# Patient Record
Sex: Male | Born: 1945
Health system: Southern US, Community
[De-identification: ages and names within clinical notes are randomized; demographics above are authoritative.]

## PROBLEM LIST (undated history)

## (undated) DIAGNOSIS — D649 Anemia, unspecified: Secondary | ICD-10-CM

## (undated) DIAGNOSIS — B019 Varicella without complication: Secondary | ICD-10-CM

## (undated) DIAGNOSIS — M1990 Unspecified osteoarthritis, unspecified site: Secondary | ICD-10-CM

## (undated) DIAGNOSIS — G4730 Sleep apnea, unspecified: Secondary | ICD-10-CM

## (undated) DIAGNOSIS — E785 Hyperlipidemia, unspecified: Secondary | ICD-10-CM

## (undated) DIAGNOSIS — J439 Emphysema, unspecified: Secondary | ICD-10-CM

## (undated) HISTORY — DX: Unspecified osteoarthritis, unspecified site: M19.90

## (undated) HISTORY — PX: BASAL CELL CARCINOMA EXCISION: SHX1214

## (undated) HISTORY — DX: Hyperlipidemia, unspecified: E78.5

## (undated) HISTORY — DX: Emphysema, unspecified: J43.9

## (undated) HISTORY — DX: Varicella without complication: B01.9

---

## 2015-08-08 ENCOUNTER — Ambulatory Visit (INDEPENDENT_AMBULATORY_CARE_PROVIDER_SITE_OTHER): Payer: PRIVATE HEALTH INSURANCE | Admitting: Family Medicine

## 2015-08-08 ENCOUNTER — Ambulatory Visit (INDEPENDENT_AMBULATORY_CARE_PROVIDER_SITE_OTHER)
Admission: RE | Admit: 2015-08-08 | Discharge: 2015-08-08 | Disposition: A | Discharge status: Home / Self Care | Payer: PRIVATE HEALTH INSURANCE | Source: Ambulatory Visit | Attending: Family Medicine | Admitting: Family Medicine

## 2015-08-08 ENCOUNTER — Encounter: Payer: Self-pay | Admitting: Family Medicine

## 2015-08-08 VITALS — BP 140/84 | HR 76 | Wt 204.0 lb

## 2015-08-08 DIAGNOSIS — M545 Low back pain, unspecified: Secondary | ICD-10-CM | POA: Insufficient documentation

## 2015-08-08 MED ORDER — METHYLPREDNISOLONE ACETATE 80 MG/ML IJ SUSP
80.0000 mg | Freq: Once | INTRAMUSCULAR | Status: AC
  Administered 2015-08-08: 80 mg via INTRAMUSCULAR

## 2015-08-08 MED ORDER — MELOXICAM 15 MG PO TABS: 15.0000 mg | ORAL_TABLET | Freq: Every day | ORAL | Status: DC

## 2015-08-08 MED ORDER — KETOROLAC TROMETHAMINE 60 MG/2ML IM SOLN
60.0000 mg | Freq: Once | INTRAMUSCULAR | Status: AC
  Administered 2015-08-08: 60 mg via INTRAMUSCULAR

## 2015-08-08 MED ORDER — TIZANIDINE HCL 4 MG PO TABS: 4.0000 mg | ORAL_TABLET | Freq: Every evening | ORAL | Status: DC

## 2015-08-08 NOTE — Patient Instructions (Signed)
Great to see you  Ice 20 minutes 2 times daily. Usually after activity and before bed. Keep doing your stretches but only 4-5 times a week.  2 injections today to help Meolxicam daily for 10 days then as needed Zanaflex at night for next week then as needed.  Vitamin D 2000 IU daily  Xrays downstairs today  See me again in 2 weeks or so and then we will consider physical therapy or manipulation.

## 2015-08-08 NOTE — Progress Notes (Signed)
[facility] [address] [place], [place] [address] Phone: [phone] Subjective:    I CC: chronic back pain  [id number] Terry Cameron is a 70 y.o. male coming in with complaint of chronic back pain. Patient states that this is been going on for years. Patient denies any type of injury. Has seen many different providers including a chiropractor as well as a physical therapist. Patient states that he continues to have a dull, throbbing aching sensation that seems to be most of the time. Over the course last several weeks it seems to be worsening. States that he can even give him pain straightening up. Was able to walk for 2 days secondary to the pain. Patient denies any radiation down the legs or any numbness or tingling. Patient denies any fevers chills or any abnormal weight loss. Has never had any imaging done. Still been able to do daily activities up to the previous days mention. Rates the severity of pain at this time a 6 out of 10. Has responded to over-the-counter anti-inflammatories previously.     Past Medical History  Diagnosis Date  . Arthritis   . Chicken pox   . Emphysema of lung [facility])    Past Surgical History  Procedure Laterality Date  . Basal cell carcinoma excision     Social History   Social History  . Marital Status: Married    Spouse Name: N/A  . Number of Children: N/A  . Years of Education: N/A   Social History Main Topics  . Smoking status: Former Research scientist (life sciences)  . Smokeless tobacco: Never Used  . Alcohol Use: Yes  . Drug Use: No  . Sexual Activity: Not Asked   Other Topics Concern  . None   Social History Narrative  . None   Not on Fileno known drug allergies History reviewed. No pertinent family history.no history of rheumatological diseases  Past medical history, social, surgical and family history all reviewed in electronic medical record.  No pertanent information unless stated regarding to the chief complaint.    Review of Systems: No headache, visual changes, nausea, vomiting, diarrhea, constipation, dizziness, abdominal pain, skin rash, fevers, chills, night sweats, weight loss, swollen lymph nodes, body aches, joint swelling, muscle aches, chest pain, shortness of breath, mood changes.   Objective Blood pressure 140/84, pulse 76, weight 204 lb (92.534 kg).  General: No apparent distress alert and oriented x3 mood and affect normal, dressed appropriately.  HEENT: Pupils equal, extraocular movements intact  Respiratory: Patient's speak in full sentences and does not appear short of breath  Cardiovascular: No lower extremity edema, non tender, no erythema  Skin: Warm dry intact with no signs of infection or rash on extremities or on axial skeleton.  Abdomen: Soft nontender  Neuro: Cranial nerves II through XII are intact, neurovascularly intact in all extremities with 2+ DTRs and 2+ pulses.  Lymph: No lymphadenopathy of posterior or anterior cervical chain or axillae bilaterally.  Gait normal with good balance and coordination.  MSK:  Non tender with full range of motion and good stability and symmetric strength and tone of shoulders, elbows, wrist, hip, knee and ankles bilaterally.  Back Exam:  Inspection: Unremarkable  Motion: Flexion 45 deg, Extension 45 deg, Side Bending to 45 deg bilaterally,  Rotation to 45 deg bilaterally  SLR laying: Negative  XSLR laying: Negative  Palpable tenderness: tenderness over the L4-L5 area on the right side paraspinal musculature. No spinous process tenderness FABER: negative. Sensory change: Gross sensation  intact to all lumbar and sacral dermatomes.  Reflexes: 2+ at both patellar tendons, 2+ at achilles tendons, Babinski's downgoing.  Strength at foot  Plantar-flexion: 5/5 Dorsi-flexion: 5/5 Eversion: 5/5 Inversion: 5/5  Leg strength  Quad: 5/5 Hamstring: 5/5 Hip flexor: 5/5 Hip abductors: 4/5  Gait unremarkable.    Impression and Recommendations:      This case required medical decision making of moderate complexity.      Note: This dictation was prepared with Dragon dictation along with smaller phrase technology. Any transcriptional errors that result from this process are unintentional.

## 2015-08-08 NOTE — Assessment & Plan Note (Signed)
Patient does have low back pain that likely is muscular skeletal in nature. X-rays are pending see how much bony under modalities could be contribute in. Encourage patient to continue to do the stretching that he has learned for multiple years. We discussed core strengthening as well. Patient has work with a physical therapist previously. Patient given oral anti-inflammatories and a muscle relaxer at night. We discussed icing regimen. Patient come back in 2 weeks. At that time if doing better and x-rays remarkably normal we will consider osteopathic manipulation.

## 2015-08-29 ENCOUNTER — Encounter: Payer: Self-pay | Admitting: Family Medicine

## 2015-08-29 ENCOUNTER — Ambulatory Visit (INDEPENDENT_AMBULATORY_CARE_PROVIDER_SITE_OTHER): Payer: PRIVATE HEALTH INSURANCE | Admitting: Family Medicine

## 2015-08-29 VITALS — BP 132/72 | HR 85 | Wt 202.1 lb

## 2015-08-29 DIAGNOSIS — M545 Low back pain, unspecified: Secondary | ICD-10-CM

## 2015-08-29 DIAGNOSIS — M24542 Contracture, left hand: Secondary | ICD-10-CM | POA: Diagnosis not present

## 2015-08-29 NOTE — Patient Instructions (Signed)
Good to see you  Ice 20 minutes 2 times daily. Usually after activity and before bed. Use the meloxicam and the zanaflex for 3 days straight if any worsening symptoms Injected the contracture in the finger today .  Should get better in 2 weeks but if not then would need to see a Copy.  Concern with the loss of strength  See me when you need me.

## 2015-08-29 NOTE — Progress Notes (Signed)
[facility] [address] [place], [place] [address] Phone: [phone] Subjective:    CC: chronic back pain f/u  [id number] Terry Cameron is a 70 y.o. male coming in with complaint of chronic back pain. Patient was seen previously and had more of a muscular skeletal injury. Patient was given anti-inflammatories and a muscle relaxer as well as home exercises. States that within 48 hours was nearly pain-free. Mild tightness from time to time. Nothing that stopping him from activities. Overall feeling very well.  Patient was also complaining of a left small finger problem. Patient states that it seems to be getting tighter and tighter and harder to straighten completely. Patient states that he is noticing some mild weakness. Has been seen previously and stated that they were told that he had a fracture and would need surgery at some point.     Past Medical History  Diagnosis Date  . Arthritis   . Chicken pox   . Emphysema of lung [facility])    Past Surgical History  Procedure Laterality Date  . Basal cell carcinoma excision     Social History   Social History  . Marital Status: Married    Spouse Name: N/A  . Number of Children: N/A  . Years of Education: N/A   Social History Main Topics  . Smoking status: Former Research scientist (life sciences)  . Smokeless tobacco: Never Used  . Alcohol Use: Yes  . Drug Use: No  . Sexual Activity: Not on file   Other Topics Concern  . Not on file   Social History Narrative  . No narrative on file   Not on Fil[place] known drug allergies No family history on file.no history of rheumatological diseases  Past medical history, social, surgical and family history all reviewed in electronic medical record.  No pertanent information unless stated regarding to the chief complaint.   Review of Systems: No headache, visual changes, nausea, vomiting, diarrhea, constipation, dizziness, abdominal pain, skin rash, fevers, chills, night sweats,  weight loss, swollen lymph nodes, body aches, joint swelling, muscle aches, chest pain, shortness of breath, mood changes.   Objective Blood pressure 132/72, pulse 85, weight 202 lb 2 oz (91.683 kg), SpO2 95 %.  General: No apparent distress alert and oriented x3 mood and affect normal, dressed appropriately.  HEENT: Pupils equal, extraocular movements intact  Respiratory: Patient's speak in full sentences and does not appear short of breath  Cardiovascular: No lower extremity edema, non tender, no erythema  Skin: Warm dry intact with no signs of infection or rash on extremities or on axial skeleton.  Abdomen: Soft nontender  Neuro: Cranial nerves II through XII are intact, neurovascularly intact in all extremities with 2+ DTRs and 2+ pulses.  Lymph: No lymphadenopathy of posterior or anterior cervical chain or axillae bilaterally.  Gait normal with good balance and coordination.  MSK:  Non tender with full range of motion and good stability and symmetric strength and tone of shoulders, elbows, wrist, hip, knee and ankles bilaterally.   Left hand exam shows the patient does have a contracture deformity of the fifth finger. Unable to straighten it past 120. Patient does have a cystic formation within the tendon sheath proximal to the A2 pulley.  Back Exam:  Inspection: Unremarkable  Motion: Flexion 45 deg, Extension 45 deg, Side Bending to 45 deg bilaterally,  Rotation to 45 deg bilaterally  SLR laying: Negative  XSLR laying: Negative  Palpable tenderness:  Nontender today tenderness FABER: negative. Sensory change:  Gross sensation intact to all lumbar and sacral dermatomes.  Reflexes: 2+ at both patellar tendons, 2+ at achilles tendons, Babinski's downgoing.  Strength at foot  Plantar-flexion: 5/5 Dorsi-flexion: 5/5 Eversion: 5/5 Inversion: 5/5  Leg strength  Quad: 5/5 Hamstring: 5/5 Hip flexor: 5/5 Hip abductors: 4/5  Gait unremarkable.   After verbal consent patient was prepped  with alcohol swab and with a 25-gauge half-inch needle was injected with a total of 0.5 mL of 0.5% Marcaine and 0.5 mL of Kenalog 40 mg/dL in the fifth tendon sheath of the flexor tendon of the fifth finger on the left. No blood loss. Patient tolerated procedure well.  Impression and Recommendations:     This case required medical decision making of moderate complexity.      Note: This dictation was prepared with Dragon dictation along with smaller phrase technology. Any transcriptional errors that result from this process are unintentional.

## 2015-08-29 NOTE — Progress Notes (Signed)
Pre visit review using our clinic review tool, if applicable. No additional management support is needed unless otherwise documented below in the visit note. 

## 2015-08-29 NOTE — Assessment & Plan Note (Signed)
Patient given injection today. Hopefully this will decrease the size. Patient has anti-inflammatories for breathing breakthrough pain. Questionable ganglion cyst is in the area. We discussed the possibility of aspiration with a bigger needle patient will follow-up with a hand surgeon in 2 weeks it hasn't resolved.

## 2015-08-29 NOTE — Assessment & Plan Note (Signed)
Patient's immediately improved at this time. Seems to be muscular. Very mild arthritic changes noted on x-ray previously. Follow-up as needed.

## 2015-09-07 ENCOUNTER — Other Ambulatory Visit: Payer: Self-pay | Admitting: Family Medicine

## 2015-09-09 NOTE — Telephone Encounter (Signed)
Refill done.  

## 2017-01-01 ENCOUNTER — Ambulatory Visit (INDEPENDENT_AMBULATORY_CARE_PROVIDER_SITE_OTHER): Payer: Managed Care, Other (non HMO) | Admitting: Family Medicine

## 2017-01-01 ENCOUNTER — Encounter: Payer: Self-pay | Admitting: Family Medicine

## 2017-01-01 ENCOUNTER — Other Ambulatory Visit: Payer: Self-pay | Admitting: Family Medicine

## 2017-01-01 VITALS — BP 120/74 | HR 75 | Temp 98.4°F | Ht 70.0 in | Wt 201.8 lb

## 2017-01-01 DIAGNOSIS — Z23 Encounter for immunization: Secondary | ICD-10-CM | POA: Diagnosis not present

## 2017-01-01 DIAGNOSIS — E785 Hyperlipidemia, unspecified: Secondary | ICD-10-CM

## 2017-01-01 DIAGNOSIS — G4733 Obstructive sleep apnea (adult) (pediatric): Secondary | ICD-10-CM | POA: Diagnosis not present

## 2017-01-01 DIAGNOSIS — E663 Overweight: Secondary | ICD-10-CM

## 2017-01-01 DIAGNOSIS — Z1211 Encounter for screening for malignant neoplasm of colon: Secondary | ICD-10-CM | POA: Diagnosis not present

## 2017-01-01 DIAGNOSIS — Z1159 Encounter for screening for other viral diseases: Secondary | ICD-10-CM | POA: Insufficient documentation

## 2017-01-01 LAB — COMPREHENSIVE METABOLIC PANEL
ALBUMIN: 4 g/dL (ref 3.5–5.2)
ALK PHOS: 59 U/L (ref 39–117)
ALT: 18 U/L (ref 0–53)
AST: 18 U/L (ref 0–37)
BUN: 20 mg/dL (ref 6–23)
CO2: 28 mEq/L (ref 19–32)
Calcium: 9.6 mg/dL (ref 8.4–10.5)
Chloride: 108 mEq/L (ref 96–112)
Creatinine, Ser: 1.11 mg/dL (ref 0.40–1.50)
GFR: 69.69 mL/min (ref >60.00)
GLUCOSE: 104 mg/dL — AB (ref 70–99)
POTASSIUM: 4.2 meq/L (ref 3.5–5.1)
SODIUM: 141 meq/L (ref 135–145)
TOTAL PROTEIN: 6.8 g/dL (ref 6.0–8.3)
Total Bilirubin: 0.5 mg/dL (ref 0.2–1.2)

## 2017-01-01 LAB — HEMOGLOBIN A1C: Hgb A1c MFr Bld: 5.7 % (ref 4.6–6.5)

## 2017-01-01 LAB — LIPID PANEL
CHOLESTEROL: 237 mg/dL — AB (ref 0–200)
HDL: 56.4 mg/dL (ref >39.00)
LDL Cholesterol: 155 mg/dL — ABNORMAL HIGH (ref 0–99)
NONHDL: 181.06
Total CHOL/HDL Ratio: 4
Triglycerides: 129 mg/dL (ref 0.0–149.0)
VLDL: 25.8 mg/dL (ref 0.0–40.0)

## 2017-01-01 MED ORDER — ATORVASTATIN CALCIUM 10 MG PO TABS: 10.0000 mg | ORAL_TABLET | Freq: Every day | ORAL | 3 refills | Status: DC

## 2017-01-01 NOTE — Patient Instructions (Signed)
Nice to see you. We'll get you is set up with GI for colonoscopy. We'll get lab work today and contact you with results.

## 2017-01-01 NOTE — Assessment & Plan Note (Signed)
Patient falls into age range risk category. We will check hep C antibody today.

## 2017-01-01 NOTE — Assessment & Plan Note (Signed)
Has been out of medication for a number of months. We'll restart on this. We'll check lab work as outlined below.

## 2017-01-01 NOTE — Progress Notes (Signed)
[name], MD Phone: [phone]  Terry Cameron is a 71 y.o. male who presents today for new patient visit.  Hyperlipidemia: Taking Lipitor though has been out for several months. No abdominal pain or myalgias. No chest pain or shortness of breath.  OSA: Currently using CPAP nightly for 7-8 hours. He sleeps well with this. Wakes up well rested. No hypersomnia during the day.  Colon cancer screening: Patient reports having these done every 5 years. They've been clean. He is due for repeat per his report.  Active Ambulatory Problems    Diagnosis Date Noted  . Low back pain 08/08/2015  . 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 08/29/2015  . Hyperlipidemia 01/01/2017  . OSA (obstructive sleep apnea) 01/01/2017  . Colon cancer screening 01/01/2017  . Need for hepatitis C screening test 01/01/2017   Resolved Ambulatory Problems    Diagnosis Date Noted  . No Resolved Ambulatory Problems   Past Medical History:  Diagnosis Date  . Arthritis   . Chicken pox   . Emphysema of lung ([place])   . Hyperlipidemia     Family History  Problem Relation Age of Onset  . Hypertension Father   . Heart attack Father   . Diabetes Maternal Grandmother     Social History   Social History  . Marital status: Married    Spouse name: N/A  . Number of children: N/A  . Years of education: N/A   Occupational History  . Not on file.   Social History Main Topics  . Smoking status: Former Research scientist (life sciences)  . Smokeless tobacco: Never Used  . Alcohol use Yes  . Drug use: No  . Sexual activity: Not on file   Other Topics Concern  . Not on file   Social History Narrative  . No narrative on file    ROS  General:  Negative for nexplained weight loss, fever Skin: Negative for new or changing mole, sore that won't heal HEENT: Negative for trouble hearing, trouble seeing, ringing in ears, mouth sores, hoarseness, change in voice, dysphagia. CV:  Negative for chest pain, dyspnea, edema,  palpitations Resp: Negative for cough, dyspnea, hemoptysis GI: Negative for nausea, vomiting, diarrhea, constipation, abdominal pain, melena, hematochezia. GU: Negative for dysuria, incontinence, urinary hesitance, hematuria, vaginal or penile discharge, polyuria, sexual difficulty, lumps in testicle or breasts MSK: Negative for muscle cramps or aches, joint pain or swelling Neuro: Negative for headaches, weakness, numbness, dizziness, passing out/fainting Psych: Negative for depression, anxiety, memory problems  Objective  Physical Exam Vitals:   01/01/17 0909  BP: 120/74  Pulse: 75  Temp: 98.4 F (36.9 C)  SpO2: 98%    BP Readings from Last 3 Encounters:  01/01/17 120/74  08/29/15 132/72  08/08/15 140/84   Wt Readings from Last 3 Encounters:  01/01/17 201 lb 12.8 oz (91.5 kg)  08/29/15 202 lb 2 oz (91.7 kg)  08/08/15 204 lb (92.5 kg)    Physical Exam  Constitutional: No distress.  HENT:  Head: Normocephalic and atraumatic.  Mouth/Throat: Oropharynx is clear and moist.  Eyes: Pupils are equal, round, and reactive to light. Conjunctivae are normal.  Cardiovascular: Normal rate, regular rhythm and normal heart sounds.   Pulmonary/Chest: Effort normal and breath sounds normal.  Abdominal: Soft. Bowel sounds are normal. He exhibits no distension. There is no tenderness. There is no rebound and no guarding.  Musculoskeletal: He exhibits no edema.  Neurological: He is alert. Gait normal.  Skin: Skin is warm and dry. He is not  diaphoretic.  Psychiatric: Mood and affect normal.     Assessment/Plan:   Hyperlipidemia Has been out of medication for a number of months. We'll restart on this. We'll check lab work as outlined below.  OSA (obstructive sleep apnea) Using CPAP nightly. Seems to be well controlled. He'll continue CPAP.  Colon cancer screening Refer to GI for colonoscopy.  Need for hepatitis C screening test Patient falls into age range risk category. We will  check hep C antibody today.   Orders Placed This Encounter  Procedures  . Tdap vaccine greater than or equal to 7yo IM  . Pneumococcal conjugate vaccine 13-valent  . Comp Met (CMET)  . Lipid Profile  . HgB A1c  . Hepatitis C Antibody  . Ambulatory referral to Gastroenterology    Referral Priority:   Routine    Referral Type:   Consultation    Referral Reason:   Specialty Services Required    Number of Visits Requested:   1    Meds ordered this encounter  Medications  . DISCONTD: atorvastatin (LIPITOR) 10 MG tablet    Sig: Place 10 mg into the right eye daily.  Marland Kitchen atorvastatin (LIPITOR) 10 MG tablet    Sig: Take 1 tablet (10 mg total) by mouth daily.    Dispense:  90 tablet    Refill:  [place], MD [place]

## 2017-01-01 NOTE — Assessment & Plan Note (Signed)
Using CPAP nightly. Seems to be well controlled. He'll continue CPAP.

## 2017-01-01 NOTE — Assessment & Plan Note (Signed)
Refer to GI for colonoscopy.

## 2017-01-02 LAB — HEPATITIS C ANTIBODY: HCV Ab: NONREACTIVE

## 2017-01-04 ENCOUNTER — Telehealth: Payer: Self-pay | Admitting: Family Medicine

## 2017-01-04 NOTE — Telephone Encounter (Signed)
See result note.  

## 2017-01-04 NOTE — Telephone Encounter (Signed)
Pt called back returning your call. Thank you! °

## 2017-01-11 ENCOUNTER — Telehealth: Payer: Self-pay | Admitting: Gastroenterology

## 2017-01-11 NOTE — Telephone Encounter (Signed)
Patient returned a call to schedule a colonoscopy. Please call

## 2017-01-13 NOTE — Telephone Encounter (Signed)
LVM for patient callback in response to phone message.

## 2017-01-25 ENCOUNTER — Telehealth: Payer: Self-pay

## 2017-01-25 ENCOUNTER — Other Ambulatory Visit: Payer: Self-pay

## 2017-01-25 DIAGNOSIS — Z86010 Personal history of colonic polyps: Secondary | ICD-10-CM

## 2017-01-25 NOTE — Telephone Encounter (Signed)
Gastroenterology Pre-Procedure Review  Request Date: Monday, 10/8 Requesting Physician: Dr. Vicente Males  PATIENT REVIEW QUESTIONS: The patient responded to the following health history questions as indicated:    1. Are you having any GI issues? no 2. Do you have a personal history of Polyps? yes (benign) 3. Do you have a family history of Colon Cancer or Polyps? no 4. Diabetes Mellitus? no 5. Joint replacements in the past 12 months?no 6. Major health problems in the past 3 months?no 7. Any artificial heart valves, MVP, or defibrillator?no    MEDICATIONS & ALLERGIES:    Patient reports the following regarding taking any anticoagulation/antiplatelet therapy:   Plavix, Coumadin, Eliquis, Xarelto, Lovenox, Pradaxa, Brilinta, or Effient? no Aspirin? yes (81mg )  Patient confirms/reports the following medications:  Current Outpatient Prescriptions  Medication Sig Dispense Refill  . aspirin (ASPIR-81) 81 MG EC tablet Aspir-81  qd    . atorvastatin (LIPITOR) 10 MG tablet Take 1 tablet (10 mg total) by mouth daily. 90 tablet 3  . cyclobenzaprine (FLEXERIL) 10 MG tablet cyclobenzaprine 10 mg tablet  Take 1 tablet 3 times a day by oral route as needed for 10 days.     No current facility-administered medications for this visit.     Patient confirms/reports the following allergies:  No Known Allergies  No orders of the defined types were placed in this encounter.   AUTHORIZATION INFORMATION Primary Insurance: 1D#: Group #:  Secondary Insurance: 1D#: Group #:  SCHEDULE INFORMATION: Date: 10/8 Time: Location: [place]

## 2017-02-05 ENCOUNTER — Other Ambulatory Visit (INDEPENDENT_AMBULATORY_CARE_PROVIDER_SITE_OTHER): Payer: Managed Care, Other (non HMO)

## 2017-02-05 ENCOUNTER — Telehealth: Payer: Self-pay

## 2017-02-05 DIAGNOSIS — E785 Hyperlipidemia, unspecified: Secondary | ICD-10-CM

## 2017-02-05 LAB — HEPATIC FUNCTION PANEL
ALT: 21 U/L (ref 0–53)
AST: 20 U/L (ref 0–37)
Albumin: 4.1 g/dL (ref 3.5–5.2)
Alkaline Phosphatase: 55 U/L (ref 39–117)
BILIRUBIN DIRECT: 0.1 mg/dL (ref 0.0–0.3)
TOTAL PROTEIN: 6.7 g/dL (ref 6.0–8.3)
Total Bilirubin: 0.5 mg/dL (ref 0.2–1.2)

## 2017-02-05 LAB — LDL CHOLESTEROL, DIRECT: Direct LDL: 86 mg/dL

## 2017-02-05 NOTE — Telephone Encounter (Signed)
Patient came in today complaining of nose bleeds. Patient states that the bleeding is not excessive and can be stopped with tissues and pressure. Patient has not had any bleeding today. The last one was 2 days ago. PCP is aware and recommended Saline nasal spray. Advised patient to try the saline spray and if nose bleeds persist, he should call us and make an appointment with PCP. VS within normal limits.   BP 128/70 P 68 T 97.9 R 16 O2 97%

## 2017-02-05 NOTE — Telephone Encounter (Signed)
Noted and agree. 

## 2017-02-10 ENCOUNTER — Telehealth: Payer: Self-pay | Admitting: Family Medicine

## 2017-02-10 NOTE — Telephone Encounter (Signed)
Pt called and wanted to find out more about his last lab results. He also mentioned that he is continuing to have bloody nose.  Call pt @ [phone]

## 2017-02-10 NOTE — Telephone Encounter (Signed)
Please get patient set up for evaluation of his nosebleeds in the office. Thanks.

## 2017-02-10 NOTE — Telephone Encounter (Signed)
Patient notified of lab results, patient states he still has a nose bleed every morning

## 2017-02-10 NOTE — Telephone Encounter (Signed)
Patient is scheduled   

## 2017-02-12 ENCOUNTER — Encounter: Payer: Self-pay | Admitting: Family Medicine

## 2017-02-12 ENCOUNTER — Ambulatory Visit (INDEPENDENT_AMBULATORY_CARE_PROVIDER_SITE_OTHER): Payer: Managed Care, Other (non HMO) | Admitting: Family Medicine

## 2017-02-12 VITALS — BP 118/70 | HR 82 | Temp 97.7°F | Wt 201.2 lb

## 2017-02-12 DIAGNOSIS — R040 Epistaxis: Secondary | ICD-10-CM | POA: Diagnosis not present

## 2017-02-12 DIAGNOSIS — G4733 Obstructive sleep apnea (adult) (pediatric): Secondary | ICD-10-CM | POA: Diagnosis not present

## 2017-02-12 LAB — CBC
HCT: 41.8 % (ref 39.0–52.0)
HEMOGLOBIN: 13.8 g/dL (ref 13.0–17.0)
MCHC: 33.1 g/dL (ref 30.0–36.0)
MCV: 98 fl (ref 78.0–100.0)
PLATELETS: 210 10*3/uL (ref 150.0–400.0)
RBC: 4.27 Mil/uL (ref 4.22–5.81)
RDW: 13.5 % (ref 11.5–15.5)
WBC: 6.8 10*3/uL (ref 4.0–10.5)

## 2017-02-12 NOTE — Assessment & Plan Note (Signed)
No discretely identifiable source. Discussed nasal saline gel. Does sound as though he might have allergic rhinitis or slight viral illness. He can try adding Claritin or Flonase as well. Discussed methods for getting nosebleeds to stop. If it is persistent or excessive he'll be evaluated. We'll check a CBC.

## 2017-02-12 NOTE — Patient Instructions (Signed)
Nice to see you. We'll check some lab work today and contact you with the results. We will get you set up for a sleep study. If you have bleeding which you cannot get to stop within 15 minutes please be evaluated. You can try nasal saline gel or could also try adding Claritin or Flonase to help with your allergy symptoms.

## 2017-02-12 NOTE — Progress Notes (Signed)
  [name], MD Phone: [phone]  Terry Cameron is a 71 y.o. male who presents today for follow-up.  OSA: Notes it has been about 6 years since his last sleep study. He does note restless sleep. He can fall asleep during the day fairly easily. Somewhat well rested in the morning.  Patient has had intermittent nosebleeds over the last week or so. Notes that it will start dripping a little bit of blood out of his right nostril. He's had issues in the past that needed cauterization. He noted one time in the last week having a little bit of blood when brushing his teeth though otherwise no other bleeding. He's tried nasal saline which has been beneficial. No bleeding yesterday or today. No family history of bleeding. Patient does report some sneezing and rhinorrhea recently.   ROS see history of present illness  Objective  Physical Exam Vitals:   02/12/17 0828  BP: 118/70  Pulse: 82  Temp: 97.7 F (36.5 C)  SpO2: 98%    BP Readings from Last 3 Encounters:  02/12/17 118/70  01/01/17 120/74  08/29/15 132/72   Wt Readings from Last 3 Encounters:  02/12/17 201 lb 3.2 oz (91.3 kg)  01/01/17 201 lb 12.8 oz (91.5 kg)  08/29/15 202 lb 2 oz (91.7 kg)    Physical Exam  Constitutional: No distress.  HENT:  Nasal mucosa slightly edematous, no obvious areas of bleeding  Eyes: Pupils are equal, round, and reactive to light. Conjunctivae are normal.  Cardiovascular: Normal rate, regular rhythm and normal heart sounds.   Pulmonary/Chest: Effort normal and breath sounds normal.  Neurological: He is alert. Gait normal.  Skin: He is not diaphoretic.     Assessment/Plan: Please see individual problem list.  OSA (obstructive sleep apnea) We will get him set up for a sleep study given some hypersomnia.  Epistaxis No discretely identifiable source. Discussed nasal saline gel. Does sound as though he might have allergic rhinitis or slight viral illness. He can try adding Claritin  or Flonase as well. Discussed methods for getting nosebleeds to stop. If it is persistent or excessive he'll be evaluated. We'll check a CBC.   Orders Placed This Encounter  Procedures  . CBC  . Split night study    Standing Status:   Future    Standing Expiration Date:   02/12/2018    Order Specific Question:   Where should this test be performed:    Answer:   [place]    [name], MD [place]

## 2017-02-12 NOTE — Assessment & Plan Note (Signed)
We will get him set up for a sleep study given some hypersomnia.

## 2017-02-16 ENCOUNTER — Encounter: Payer: Self-pay | Admitting: Family Medicine

## 2017-02-22 ENCOUNTER — Ambulatory Visit: Payer: Managed Care, Other (non HMO) | Admitting: Certified Registered"

## 2017-02-22 ENCOUNTER — Encounter: Payer: Self-pay | Admitting: *Deleted

## 2017-02-22 ENCOUNTER — Encounter
Admission: RE | Disposition: A | Discharge status: Home / Self Care | Payer: Self-pay | Source: Ambulatory Visit | Attending: Gastroenterology

## 2017-02-22 ENCOUNTER — Ambulatory Visit
Admission: RE | Admit: 2017-02-22 | Discharge: 2017-02-22 | Disposition: A | Discharge status: Home / Self Care | Payer: Managed Care, Other (non HMO) | Source: Ambulatory Visit | Attending: Gastroenterology | Admitting: Gastroenterology

## 2017-02-22 DIAGNOSIS — Z1212 Encounter for screening for malignant neoplasm of rectum: Secondary | ICD-10-CM

## 2017-02-22 DIAGNOSIS — Z8249 Family history of ischemic heart disease and other diseases of the circulatory system: Secondary | ICD-10-CM | POA: Diagnosis not present

## 2017-02-22 DIAGNOSIS — Z87891 Personal history of nicotine dependence: Secondary | ICD-10-CM | POA: Diagnosis not present

## 2017-02-22 DIAGNOSIS — D123 Benign neoplasm of transverse colon: Secondary | ICD-10-CM | POA: Diagnosis not present

## 2017-02-22 DIAGNOSIS — K5730 Diverticulosis of large intestine without perforation or abscess without bleeding: Secondary | ICD-10-CM | POA: Diagnosis not present

## 2017-02-22 DIAGNOSIS — Z79899 Other long term (current) drug therapy: Secondary | ICD-10-CM | POA: Diagnosis not present

## 2017-02-22 DIAGNOSIS — D122 Benign neoplasm of ascending colon: Secondary | ICD-10-CM | POA: Insufficient documentation

## 2017-02-22 DIAGNOSIS — E785 Hyperlipidemia, unspecified: Secondary | ICD-10-CM | POA: Insufficient documentation

## 2017-02-22 DIAGNOSIS — Z1211 Encounter for screening for malignant neoplasm of colon: Secondary | ICD-10-CM | POA: Insufficient documentation

## 2017-02-22 DIAGNOSIS — Z7982 Long term (current) use of aspirin: Secondary | ICD-10-CM | POA: Diagnosis not present

## 2017-02-22 DIAGNOSIS — D120 Benign neoplasm of cecum: Secondary | ICD-10-CM | POA: Diagnosis not present

## 2017-02-22 DIAGNOSIS — M1990 Unspecified osteoarthritis, unspecified site: Secondary | ICD-10-CM | POA: Insufficient documentation

## 2017-02-22 DIAGNOSIS — Z85828 Personal history of other malignant neoplasm of skin: Secondary | ICD-10-CM | POA: Insufficient documentation

## 2017-02-22 DIAGNOSIS — J439 Emphysema, unspecified: Secondary | ICD-10-CM | POA: Diagnosis not present

## 2017-02-22 DIAGNOSIS — Z86010 Personal history of colonic polyps: Secondary | ICD-10-CM

## 2017-02-22 DIAGNOSIS — Z833 Family history of diabetes mellitus: Secondary | ICD-10-CM | POA: Diagnosis not present

## 2017-02-22 DIAGNOSIS — G4730 Sleep apnea, unspecified: Secondary | ICD-10-CM | POA: Diagnosis not present

## 2017-02-22 HISTORY — DX: Sleep apnea, unspecified: G47.30

## 2017-02-22 HISTORY — PX: COLONOSCOPY WITH PROPOFOL: SHX5780

## 2017-02-22 SURGERY — COLONOSCOPY WITH PROPOFOL
Anesthesia: General

## 2017-02-22 MED ORDER — PROPOFOL 500 MG/50ML IV EMUL
INTRAVENOUS | Status: DC | PRN
  Administered 2017-02-22: 150 ug/kg/min via INTRAVENOUS

## 2017-02-22 MED ORDER — PHENYLEPHRINE HCL 10 MG/ML IJ SOLN
INTRAMUSCULAR | Status: AC
  Filled 2017-02-22: qty 1

## 2017-02-22 MED ORDER — PHENYLEPHRINE HCL 10 MG/ML IJ SOLN
INTRAMUSCULAR | Status: DC | PRN
  Administered 2017-02-22: 100 ug via INTRAVENOUS

## 2017-02-22 MED ORDER — SODIUM CHLORIDE 0.9 % IV SOLN
INTRAVENOUS | Status: DC
  Administered 2017-02-22: 1000 mL via INTRAVENOUS
  Administered 2017-02-22: 08:00:00 via INTRAVENOUS

## 2017-02-22 MED ORDER — LIDOCAINE HCL (PF) 2 % IJ SOLN
INTRAMUSCULAR | Status: AC
  Filled 2017-02-22: qty 10

## 2017-02-22 MED ORDER — LIDOCAINE HCL (CARDIAC) 20 MG/ML IV SOLN
INTRAVENOUS | Status: DC | PRN
  Administered 2017-02-22: 40 mg via INTRAVENOUS

## 2017-02-22 MED ORDER — PROPOFOL 10 MG/ML IV BOLUS
INTRAVENOUS | Status: DC | PRN
  Administered 2017-02-22: 60 mg via INTRAVENOUS

## 2017-02-22 MED ORDER — PROPOFOL 500 MG/50ML IV EMUL
INTRAVENOUS | Status: AC
  Filled 2017-02-22: qty 50

## 2017-02-22 NOTE — Anesthesia Preprocedure Evaluation (Signed)
Anesthesia Evaluation  Patient identified by MRN, date of birth, ID band Patient awake    Reviewed: Allergy & Precautions, NPO status , Patient's Chart, lab work & pertinent test results, reviewed documented beta blocker date and time   Airway Mallampati: II  TM Distance: >3 FB     Dental  (+) Chipped   Pulmonary sleep apnea , COPD, former smoker,           Cardiovascular + Peripheral Vascular Disease       Neuro/Psych    GI/Hepatic   Endo/Other    Renal/GU      Musculoskeletal  (+) Arthritis ,   Abdominal   Peds  Hematology   Anesthesia Other Findings   Reproductive/Obstetrics                             Anesthesia Physical Anesthesia Plan  ASA: III  Anesthesia Plan: General   Post-op Pain Management:    Induction: Intravenous  PONV Risk Score and Plan:   Airway Management Planned:   Additional Equipment:   Intra-op Plan:   Post-operative Plan:   Informed Consent: I have reviewed the patients History and Physical, chart, labs and discussed the procedure including the risks, benefits and alternatives for the proposed anesthesia with the patient or authorized representative who has indicated his/her understanding and acceptance.     Plan Discussed with: CRNA  Anesthesia Plan Comments:         Anesthesia Quick Evaluation

## 2017-02-22 NOTE — H&P (Signed)
  [name] MD [address] [place], [place] [address] Phone: [phone] Fax : [phone]  Primary Care Physician:  Leone Haven, MD Primary Gastroenterologist:  Dr. [name]   Pre-Procedure History & Physical: HPI:  Terry Cameron is a 71 y.o. male is here for an colonoscopy.   Past Medical History:  Diagnosis Date  . Arthritis   . Chicken pox   . Emphysema of lung ([place])   . Hyperlipidemia   . Sleep apnea     Past Surgical History:  Procedure Laterality Date  . BASAL CELL CARCINOMA EXCISION      Prior to Admission medications   Medication Sig Start Date End Date Taking? Authorizing Provider  aspirin (ASPIR-81) 81 MG EC tablet Aspir-81  qd    [provider]  atorvastatin (LIPITOR) 10 MG tablet Take 1 tablet (10 mg total) by mouth daily. 01/01/17   Leone Haven, MD  cyclobenzaprine (FLEXERIL) 10 MG tablet cyclobenzaprine 10 mg tablet  Take 1 tablet 3 times a day by oral route as needed for 10 days.    [provider]    Allergies as of 01/25/2017  . (No Known Allergies)    Family History  Problem Relation Age of Onset  . Hypertension Father   . Heart attack Father   . Diabetes Maternal Grandmother     Social History   Social History  . Marital status: Married    Spouse name: N/A  . Number of children: N/A  . Years of education: N/A   Occupational History  . Not on file.   Social History Main Topics  . Smoking status: Former Smoker    Quit date: 1974  . Smokeless tobacco: Never Used  . Alcohol use Yes  . Drug use: No  . Sexual activity: Not on file   Other Topics Concern  . Not on file   Social History Narrative  . No narrative on file    Review of Systems: See HPI, otherwise negative ROS  Physical Exam: BP 134/69   Pulse 75   Temp (!) 97.3 F (36.3 C) (Tympanic)   Resp 16   Ht 5\' 10"  (1.778 m)   Wt 200 lb (90.7 kg)   SpO2 99%   BMI 28.70 kg/m  General:   Alert,  pleasant and cooperative  in NAD Head:  Normocephalic and atraumatic. Neck:  Supple; no masses or thyromegaly. Lungs:  Clear throughout to auscultation.    Heart:  Regular rate and rhythm. Abdomen:  Soft, nontender and nondistended. Normal bowel sounds, without guarding, and without rebound.   Neurologic:  Alert and  oriented x4;  grossly normal neurologically.  Impression/Plan: Terry Cameron is here for an colonoscopy to be performed for Screening colonoscopy average risk    Risks, benefits, limitations, and alternatives regarding  colonoscopy have been reviewed with the patient.  Questions have been answered.  All parties agreeable.   [name], MD  02/22/2017, 8:03 AM

## 2017-02-22 NOTE — Op Note (Signed)
[facility] Gastroenterology Patient Name: Terry Cameron Procedure Date: 02/22/2017 8:04 AM MRN: [id number] Account #: [id number] Date of Birth: [birth year] Admit Type: Outpatient Age: 71 Room: [facility] ENDO ROOM 4 Gender: Male Note Status: Finalized Procedure:            Colonoscopy Indications:          Screening for colorectal malignant neoplasm Providers:            [name] MD, MD Referring MD:         Angela Adam. Caryl Bis (Referring MD) Medicines:            Monitored Anesthesia Care Complications:        No immediate complications. Procedure:            Pre-Anesthesia Assessment:                       - Prior to the procedure, a History and Physical was                        performed, and patient medications, allergies and                        sensitivities were reviewed. The patient's tolerance of                        previous anesthesia was reviewed.                       - The risks and benefits of the procedure and the                        sedation options and risks were discussed with the                        patient. All questions were answered and informed                        consent was obtained.                       - ASA Grade Assessment: III - A patient with severe                        systemic disease.                       After obtaining informed consent, the colonoscope was                        passed under direct vision. Throughout the procedure,                        the patient's blood pressure, pulse, and oxygen                        saturations were monitored continuously. The                        Colonoscope was introduced through the anus and  advanced to the the cecum, identified by the                        appendiceal orifice, IC valve and transillumination.                        The colonoscopy was performed with ease. The patient                        tolerated the procedure well. The  quality of the bowel                        preparation was good. Findings:      The perianal and digital rectal examinations were normal.      Multiple small-mouthed diverticula were found in the entire colon.      Two sessile polyps were found in the transverse colon. The polyps were 3       to 5 mm in size. These polyps were removed with a cold snare. Resection       and retrieval were complete.      A 15 mm polyp was found in the transverse colon. The polyp was sessile.       Preparations were made for mucosal resection. Eleview was injected to       raise the lesion. Snare mucosal resection was performed. Resection and       retrieval were complete. To close a defect after mucosal resection, one       hemostatic clip was successfully placed. There was no bleeding during,       or at the end, of the procedure.      Three sessile polyps were found in the ascending colon. The polyps were       3 to 6 mm in size. These polyps were removed with a cold snare.       Resection and retrieval were complete.      A 3 mm polyp was found in the ascending colon. The polyp was sessile.       The polyp was removed with a cold biopsy forceps. Resection and       retrieval were complete.      Two sessile polyps were found in the ascending colon. The polyps were 3       to 4 mm in size. These polyps were removed with a cold biopsy forceps.       Resection and retrieval were complete.      Three sessile polyps were found in the cecum. The polyps were 3 to 4 mm       in size. These polyps were removed with a cold biopsy forceps. Resection       and retrieval were complete. Impression:           - Diverticulosis in the entire examined colon.                       - Two 3 to 5 mm polyps in the transverse colon, removed                        with a cold snare. Resected and retrieved.                       - One 15 mm polyp in  the transverse colon, removed with                        mucosal resection.  Resected and retrieved. Clip was                        placed.                       - Three 3 to 6 mm polyps in the ascending colon,                        removed with a cold snare. Resected and retrieved.                       - One 3 mm polyp in the ascending colon, removed with a                        cold biopsy forceps. Resected and retrieved.                       - Two 3 to 4 mm polyps in the ascending colon, removed                        with a cold biopsy forceps. Resected and retrieved.                       - Three 3 to 4 mm polyps in the cecum, removed with a                        cold biopsy forceps. Resected and retrieved.                       - Mucosal resection was performed. Resection and                        retrieval were complete. Recommendation:       - Discharge patient to home (with escort).                       - Resume previous diet.                       - Continue present medications.                       - No ibuprofen, naproxen, or other non-steroidal                        anti-inflammatory drugs for 6 weeks after polyp removal.                       - Await pathology results.                       - Repeat colonoscopy in 3 years for surveillance. Procedure Code(s):    --- Professional ---                       [id number], 1, Colonoscopy, flexible; with endoscopic  mucosal resection                       [id number], Colonoscopy, flexible; with removal of tumor(s),                        polyp(s), or other lesion(s) by snare technique                       45380, 20, Colonoscopy, flexible; with biopsy, single                        or multiple Diagnosis Code(s):    --- Professional ---                       Z12.11, Encounter for screening for malignant neoplasm                        of colon                       D12.3, Benign neoplasm of transverse colon (hepatic                        flexure or splenic flexure)                        D12.2, Benign neoplasm of ascending colon                       D12.0, Benign neoplasm of cecum                       K57.30, Diverticulosis of large intestine without                        perforation or abscess without bleeding CPT copyright 2016 American Medical Association. All rights reserved. The codes documented in this report are preliminary and upon coder review may  be revised to meet current compliance requirements. [name], MD [name] MD, MD 02/22/2017 8:46:20 AM This report has been signed electronically. Number of Addenda: 0 Note Initiated On: 02/22/2017 8:04 AM Scope Withdrawal Time: 0 hours 30 minutes 42 seconds  Total Procedure Duration: 0 hours 32 minutes 13 seconds       [facility]

## 2017-02-22 NOTE — Anesthesia Post-op Follow-up Note (Signed)
Anesthesia QCDR form completed.        

## 2017-02-22 NOTE — Transfer of Care (Signed)
Immediate Anesthesia Transfer of Care Note  Patient: Terry Cameron  Procedure(s) Performed: COLONOSCOPY WITH PROPOFOL (N/A )  Patient Location: Endoscopy Unit  Anesthesia Type:General  Level of Consciousness: drowsy and patient cooperative  Airway & Oxygen Therapy: Patient Spontanous Breathing and Patient connected to nasal cannula oxygen  Post-op Assessment: Report given to RN and Post -op Vital signs reviewed and stable  Post vital signs: Reviewed and stable  Last Vitals:  Vitals:   02/22/17 0746 02/22/17 0847  BP: 134/69 102/64  Pulse: 75 69  Resp: 16 14  Temp: (!) 36.3 C (!) 36.1 C  SpO2: 99% 100%    Last Pain:  Vitals:   02/22/17 0746  TempSrc: Tympanic         Complications: No apparent anesthesia complications

## 2017-02-22 NOTE — Anesthesia Postprocedure Evaluation (Signed)
Anesthesia Post Note  Patient: Courtney Fenlon  Procedure(s) Performed: COLONOSCOPY WITH PROPOFOL (N/A )  Patient location during evaluation: Endoscopy Anesthesia Type: General Level of consciousness: awake and alert Pain management: pain level controlled Vital Signs Assessment: post-procedure vital signs reviewed and stable Respiratory status: spontaneous breathing, nonlabored ventilation, respiratory function stable and patient connected to nasal cannula oxygen Cardiovascular status: blood pressure returned to baseline and stable Postop Assessment: no apparent nausea or vomiting Anesthetic complications: no     Last Vitals:  Vitals:   02/22/17 0907 02/22/17 0917  BP: 120/70 123/75  Pulse: 72 68  Resp: 17 (!) 21  Temp:    SpO2: 95% 98%    Last Pain:  Vitals:   02/22/17 0746  TempSrc: Tympanic                 Telesia Ates S

## 2017-02-23 ENCOUNTER — Encounter: Payer: Self-pay | Admitting: Gastroenterology

## 2017-02-23 LAB — SURGICAL PATHOLOGY

## 2017-02-24 ENCOUNTER — Encounter: Payer: Self-pay | Admitting: Gastroenterology

## 2017-02-24 ENCOUNTER — Encounter: Payer: Self-pay | Admitting: Family Medicine

## 2017-03-09 ENCOUNTER — Telehealth: Payer: Self-pay | Admitting: Gastroenterology

## 2017-03-09 NOTE — Telephone Encounter (Signed)
Patient LVM wanting pathology results. 

## 2017-03-10 ENCOUNTER — Telehealth: Payer: Self-pay

## 2017-03-10 NOTE — Telephone Encounter (Signed)
Advised patient of results.   Patient states that he also received the result letter in the mail.

## 2017-03-14 ENCOUNTER — Encounter: Payer: Self-pay | Admitting: Family Medicine

## 2017-03-15 ENCOUNTER — Telehealth: Payer: Self-pay

## 2017-03-15 NOTE — Telephone Encounter (Signed)
Left message to return call to ask questions regarding sleep study form

## 2017-03-17 ENCOUNTER — Telehealth: Payer: Self-pay | Admitting: Family Medicine

## 2017-03-17 NOTE — Telephone Encounter (Signed)
Tried to reach patient to ask question concerning symptoms of OSA.

## 2017-03-19 NOTE — Telephone Encounter (Signed)
Forms were placed in your box for his Christella Scheuermann forms to be filled out on 10/22. They are waiting on the forms before they can schedule the sleep study. I will send to [name] H to find out status of forms.

## 2017-03-22 NOTE — Telephone Encounter (Signed)
Please advise 

## 2017-03-30 NOTE — Telephone Encounter (Signed)
Please advise 

## 2017-03-30 NOTE — Telephone Encounter (Signed)
Copied from [place] [id number]. Topic: General - Other >> Mar 30, 2017  2:58 PM [name] wrote: Reason for CRM: patient states that [name] or [name] called him about a sleep study

## 2017-03-31 NOTE — Telephone Encounter (Signed)
Paper work and question completed awaiting PCP signature.

## 2017-03-31 NOTE — Telephone Encounter (Signed)
Spoke with Patient on 11/13/18concerning sleep study questionnaire awaiting PCP signature.

## 2017-04-19 ENCOUNTER — Encounter: Payer: Self-pay | Admitting: Family

## 2017-04-19 ENCOUNTER — Ambulatory Visit (INDEPENDENT_AMBULATORY_CARE_PROVIDER_SITE_OTHER)
Admission: RE | Admit: 2017-04-19 | Discharge: 2017-04-19 | Disposition: A | Discharge status: Home / Self Care | Payer: Managed Care, Other (non HMO) | Source: Ambulatory Visit | Attending: Family | Admitting: Family

## 2017-04-19 ENCOUNTER — Ambulatory Visit: Payer: Self-pay

## 2017-04-19 ENCOUNTER — Ambulatory Visit: Payer: Managed Care, Other (non HMO) | Admitting: Family

## 2017-04-19 VITALS — BP 120/70 | HR 77 | Ht 70.0 in | Wt 203.0 lb

## 2017-04-19 DIAGNOSIS — G4733 Obstructive sleep apnea (adult) (pediatric): Secondary | ICD-10-CM | POA: Diagnosis not present

## 2017-04-19 DIAGNOSIS — R0789 Other chest pain: Secondary | ICD-10-CM

## 2017-04-19 NOTE — Telephone Encounter (Signed)
  Reason for Disposition . [1] Chest pain lasting <= 5 minutes AND [2] NO chest pain or cardiac symptoms now(Exceptions: pains lasting a few seconds)  Answer Assessment - Initial Assessment Questions 1. LOCATION: "Where does it hurt?"       Tightness across upper chest. Not pain. 2. RADIATION: "Does the pain go anywhere else?" (e.g., into neck, jaw, arms, back)     No 3. ONSET: "When did the chest pain begin?" (Minutes, hours or days)      Begining of October 4. PATTERN "Does the pain come and go, or has it been constant since it started?"  "Does it get worse with exertion?"      Comes and goes. 5. DURATION: "How long does it last" (e.g., seconds, minutes, hours)     Lasts a couple of minutes 6. SEVERITY: "How bad is the pain?"  (e.g., Scale 1-10; mild, moderate, or severe)    - MILD (1-3): doesn't interfere with normal activities     - MODERATE (4-7): interferes with normal activities or awakens from sleep    - SEVERE (8-10): excruciating pain, unable to do any normal activities       2-3 7. CARDIAC RISK FACTORS: "Do you have any history of heart problems or risk factors for heart disease?" (e.g., prior heart attack, angina; high blood pressure, diabetes, being overweight, high cholesterol, smoking, or strong family history of heart disease)     High cholesterol 8. PULMONARY RISK FACTORS: "Do you have any history of lung disease?"  (e.g., blood clots in lung, asthma, emphysema, birth control pills)     No 9. CAUSE: "What do you think is causing the chest pain?"     Unsure 10. OTHER SYMPTOMS: "Do you have any other symptoms?" (e.g., dizziness, nausea, vomiting, sweating, fever, difficulty breathing, cough)       No 11. PREGNANCY: "Is there any chance you are pregnant?" "When was your last menstrual period?"        No  Protocols used: CHEST PAIN-A-AH

## 2017-04-19 NOTE — Patient Instructions (Signed)
We'll be in touch with cardiology and sleep study results;

## 2017-04-19 NOTE — Telephone Encounter (Signed)
No chest "tightness today - I just don't want to let it go." Appointment scheduled for today at [facility] due to no availability at [facility].

## 2017-04-19 NOTE — Progress Notes (Signed)
Terry Cameron is a 71 y.o. male with the following history as recorded in EpicCare:  Patient Active Problem List   Diagnosis Date Noted  . Epistaxis 02/12/2017  . Hyperlipidemia 01/01/2017  . OSA (obstructive sleep apnea) 01/01/2017  . Colon cancer screening 01/01/2017  . Need for hepatitis C screening test 01/01/2017  . 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 08/29/2015  . Low back pain 08/08/2015    Current Outpatient Medications  Medication Sig Dispense Refill  . aspirin (ASPIR-81) 81 MG EC tablet Aspir-81  qd    . atorvastatin (LIPITOR) 10 MG tablet Take 1 tablet (10 mg total) by mouth daily. 90 tablet 3  . cyclobenzaprine (FLEXERIL) 10 MG tablet cyclobenzaprine 10 mg tablet  Take 1 tablet 3 times a day by oral route as needed for 10 days.     No current facility-administered medications for this visit.     Allergies: Patient has no known allergies.  Past Medical History:  Diagnosis Date  . Arthritis   . Chicken pox   . Emphysema of lung ([place])   . Hyperlipidemia   . Sleep apnea     Past Surgical History:  Procedure Laterality Date  . BASAL CELL CARCINOMA EXCISION    . COLONOSCOPY WITH PROPOFOL N/A 02/22/2017   Procedure: COLONOSCOPY WITH PROPOFOL;  Surgeon: [name], MD;  Location: [facility] ENDOSCOPY;  Service: Gastroenterology;  Laterality: N/A;    Family History  Problem Relation Age of Onset  . Hypertension Father   . Heart attack Father   . Diabetes Maternal Grandmother     Social History   Tobacco Use  . Smoking status: Former Smoker    Last attempt to quit: 1974    Years since quitting: 44.9  . Smokeless tobacco: Never Used  . Tobacco comment: SMOKED FOR ABOUT 8 YEARS  Substance Use Topics  . Alcohol use: Yes    Comment: COUPLE TIMES A WEEK     Subjective:  Patient presents with concerns about some "chest tightness" he has experienced in the past week. He notes he experienced the symptoms while playing golf with some friends recently. He denies  any shortness of breath on exertion, headache, dizziness. He notes he just thought his shirt "was a little tight." He denies any wheezing but notes he did have cold symptoms when these symptoms occurred. He denies any concerns for burping, belching. He is continuing to take his baby aspirin and Lipitor daily.  He also wonders about getting set up for a repeat sleep study. He notes his last one was done approximately 5-6 years ago. He does not have a copy of those test results. He has a CPAP machine but is concerned that it needs to be updated. He does feel that he is sleeping well and notes his wife does not complain about snoring.     Objective:  Vitals:   04/19/17 1329  BP: 120/70  Pulse: 77  SpO2: 99%  Weight: 203 lb (92.1 kg)  Height: 5\' 10"  (1.778 m)    General: Well developed, well nourished, in no acute distress  Skin : Warm and dry.  Head: Normocephalic and atraumatic  Eyes: Sclera and conjunctiva clear; pupils round and reactive to light; extraocular movements intact  Ears: External normal; canals clear; tympanic membranes normal  Oropharynx: Pink, supple. No suspicious lesions  Neck: Supple without thyromegaly, adenopathy  Lungs: Respirations unlabored; clear to auscultation bilaterally without wheeze, rales, rhonchi  CVS exam: normal rate, regular rhythm, normal S1, S2, no  murmurs, rubs, clicks or gallops.  Abdomen: Soft; nontender; nondistended; normoactive bowel sounds; no masses or hepatosplenomegaly  Musculoskeletal: No deformities; no active joint inflammation  Extremities: No edema, cyanosis, clubbing  Neurologic: Alert and oriented; speech intact; face symmetrical; moves all extremities well; CNII-XII intact without focal deficit    Assessment:  1. Chest tightness   2. Atypical chest pain   3. Sleep apnea, obstructive      Plan:  1. & 2. Check EKG today- no acute changes noted; update CXR; will refer to cardiology for further evaluation; 3. Update referral to  neurology for sleep study; follow-up as directed.   No Follow-up on file.  Orders Placed This Encounter  Procedures  . DG Chest 2 View    Standing Status:   Future    Number of Occurrences:   1    Standing Expiration Date:   06/20/2018    Order Specific Question:   Reason for Exam (SYMPTOM  OR DIAGNOSIS REQUIRED)    Answer:   atypical chest pain    Order Specific Question:   Preferred imaging location?    Answer:   [facility]    Order Specific Question:   Radiology Contrast Protocol - do NOT remove file path    Answer:   [email or web address]  . Ambulatory referral to Cardiology    Referral Priority:   Routine    Referral Type:   Consultation    Referral Reason:   Second Opinion    Requested Specialty:   Cardiology    Number of Visits Requested:   1  . Ambulatory referral to Neurology    Referral Priority:   Routine    Referral Type:   Consultation    Referral Reason:   Specialty Services Required    Requested Specialty:   Neurology    Number of Visits Requested:   1  . EKG 12-Lead    Requested Prescriptions    No prescriptions requested or ordered in this encounter

## 2017-04-20 ENCOUNTER — Encounter: Payer: Self-pay | Admitting: Family

## 2017-05-05 ENCOUNTER — Ambulatory Visit: Payer: Managed Care, Other (non HMO) | Attending: Neurology

## 2017-05-05 DIAGNOSIS — G4733 Obstructive sleep apnea (adult) (pediatric): Secondary | ICD-10-CM | POA: Diagnosis not present

## 2017-05-15 ENCOUNTER — Encounter: Payer: Self-pay | Admitting: Family Medicine

## 2017-05-17 NOTE — Telephone Encounter (Signed)
Copied from [place] [id number]. Topic: Inquiry >> May 17, 2017 10:47 AM Neva Seat wrote: Pt looked on MyChart for recent Sleep Study results.  Not seeing the results and is being instructed to go to website.  There is no other website to view results.  Please call pt back asap with results.

## 2017-05-17 NOTE — Telephone Encounter (Signed)
Please advise 

## 2017-06-12 DIAGNOSIS — R0789 Other chest pain: Secondary | ICD-10-CM | POA: Insufficient documentation

## 2017-06-12 NOTE — Progress Notes (Signed)
Cardiology Office Note  Date:  06/14/2017   ID:  Terry Cameron, DOB 20-Sep-1945, MRN [id number]  PCP:  Leone Haven, MD   Chief Complaint  Patient presents with  . other    Ref by Dr. Caryl Bis for second opinion for chest tightness and hyperlipidemia. Pt. c/o shortness of breath on exertion and chest tightness after playing 18 holes of golf.     HPI:  72 yo male with PMH of hyperlipidemia OSA Former smoker, 8 yrs Who presents by referral from Dr. Caryl Bis for consultation of his chest tightness  He reports having a friend who recently passed away, possibly heart attack And another friend or neighbor with valve problem Wanted to get checked out make sure everything was okay  Periodically has some tightness in his chest Does not seem to be routinely associated with exertion,  No regular exercise program May have had some shortness of breath or chest tightness playing golf with some friends  shirt "was a little tight."   cold symptoms when these symptoms occurred  He has been on Lipitor for many years Ran out for 2 months, cholesterol was checked was 237, LDL 155 On Lipitor with LDL down to 51  Family history Dad with MI at age 4  EKG personally reviewed by myself on todays visit Shows normal sinus rhythm with rate 91 bpm no significant ST or T wave changes   PMH:   has a past medical history of Arthritis, Chicken pox, Emphysema of lung ([place]), Hyperlipidemia, and Sleep apnea.  PSH:    Past Surgical History:  Procedure Laterality Date  . BASAL CELL CARCINOMA EXCISION    . COLONOSCOPY WITH PROPOFOL N/A 02/22/2017   Procedure: COLONOSCOPY WITH PROPOFOL;  Surgeon: [name], MD;  Location: [facility] END[facility]COPY;  Service: Gastroenterology;  Laterality: N/A;    Current Outpatient Medications  Medication Sig Dispense Refill  . aspirin (ASPIR-81) 81 MG EC tablet Aspir-81  qd    . atorvastatin (LIPITOR) 10 MG tablet Take 1 tablet (10 mg total) by mouth daily. 90 tablet  3   No current facility-administered medications for this visit.      Allergies:   Patient has no known allergies.   Social History:  The patient  reports that he quit smoking about 45 years ago. he has never used smokeless tobacco. He reports that he drinks alcohol. He reports that he does not use drugs.   Family History:   family history includes Diabetes in his maternal grandmother; Heart attack in his father; Hypertension in his father.    Review of Systems: Review of Systems  Constitutional: Negative.   Respiratory: Positive for shortness of breath.   Cardiovascular: Positive for chest pain.  Gastrointestinal: Negative.   Musculoskeletal: Negative.   Neurological: Negative.   Psychiatric/Behavioral: Negative.   All other systems reviewed and are negative.    PHYSICAL EXAM: VS:  BP 138/60 (BP Location: Right Arm, Patient Position: Sitting, Cuff Size: Normal)   Pulse 89   Ht 5\' 10"  (1.778 m)   Wt 204 lb 8 oz (92.8 kg)   BMI 29.34 kg/m  , BMI Body mass index is 29.34 kg/m. GEN: Well nourished, well developed, in no acute distress  HEENT: normal  Neck: no JVD, carotid bruits, or masses Cardiac: RRR; no murmurs, rubs, or gallops,no edema  Respiratory:  clear to auscultation bilaterally, normal work of breathing GI: soft, nontender, nondistended, + BS MS: no deformity or atrophy  Skin: warm and dry, no rash Neuro:  Strength  and sensation are intact Psych: euthymic mood, full affect    Recent Labs: 01/01/2017: BUN 20; Creatinine, Ser 1.11; Potassium 4.2; Sodium 141 02/05/2017: ALT 21 02/12/2017: Hemoglobin 13.8; Platelets 210.0    Lipid Panel Lab Results  Component Value Date   CHOL 237 (H) 01/01/2017   HDL 56.40 01/01/2017   LDLCALC 155 (H) 01/01/2017   TRIG 129.0 01/01/2017      Wt Readings from Last 3 Encounters:  06/14/17 204 lb 8 oz (92.8 kg)  04/19/17 203 lb (92.1 kg)  02/22/17 200 lb (90.7 kg)       ASSESSMENT AND PLAN:  Mixed hyperlipidemia  - Plan: EKG 12-Lead, CT CARDIAC SCORING Numbers dramatically improved on Lipitor, LDL down to 86 Testing as below  Chest tightness - Plan: EKG 12-Lead, CT CARDIAC SCORING Long discussion concerning types of testing including stress echo, stress Myoview and calcium scoring He has atypical symptoms, minimal smoking, no diabetes, Has had relatively well controlled cholesterol for many years, reports being on Lipitor for decades Recommended coronary calcium scoring first for risk stratification If score is low, additional testing may not be needed For markedly elevated scoring, then could proceed with stress testing and more aggressive lipid management  OSA (obstructive sleep apnea) - Plan: EKG 12-Lead, CT CARDIAC SCORING Reports he is compliant with his CPAP   Disposition:   F/U as needed  Long discussion with patient and his wife concerning friends and neighbors who have had cardiac issues.  One with aortic valve disease, other with possible arrhythmia, likely atrial fibrillation  Total encounter time more than 60 minutes  Greater than 50% was spent in counseling and coordination of care with the patient    Orders Placed This Encounter  Procedures  . CT CARDIAC SCORING  . EKG 12-Lead     Signed, Esmond Plants, M.D., Ph.D. 06/14/2017  [place], [place]

## 2017-06-14 ENCOUNTER — Encounter: Payer: Self-pay | Admitting: Cardiovascular Disease

## 2017-06-14 ENCOUNTER — Ambulatory Visit (INDEPENDENT_AMBULATORY_CARE_PROVIDER_SITE_OTHER): Payer: Managed Care, Other (non HMO) | Admitting: Cardiovascular Disease

## 2017-06-14 VITALS — BP 138/60 | HR 89 | Ht 70.0 in | Wt 204.5 lb

## 2017-06-14 DIAGNOSIS — E782 Mixed hyperlipidemia: Secondary | ICD-10-CM | POA: Diagnosis not present

## 2017-06-14 DIAGNOSIS — G4733 Obstructive sleep apnea (adult) (pediatric): Secondary | ICD-10-CM | POA: Diagnosis not present

## 2017-06-14 DIAGNOSIS — R0789 Other chest pain: Secondary | ICD-10-CM | POA: Diagnosis not present

## 2017-06-14 NOTE — Patient Instructions (Signed)
Medication Instructions:   No medication changes made  Labwork:  No new labs needed  Testing/Procedures:  We will schedule a CT coronary calcium score Family hx, chest pain $150  Follow-Up: It was a pleasure seeing you in the office today. Please call us if you have new issues that need to be addressed before your next appt.  [phone]  Your physician wants you to follow-up in:  As needed  If you need a refill on your cardiac medications before your next appointment, please call your pharmacy.

## 2017-06-25 ENCOUNTER — Ambulatory Visit
Admission: RE | Admit: 2017-06-25 | Discharge: 2017-06-25 | Disposition: A | Discharge status: Home / Self Care | Payer: Self-pay | Source: Ambulatory Visit | Attending: Cardiovascular Disease | Admitting: Cardiovascular Disease

## 2017-06-25 DIAGNOSIS — G4733 Obstructive sleep apnea (adult) (pediatric): Secondary | ICD-10-CM

## 2017-06-25 DIAGNOSIS — R0789 Other chest pain: Secondary | ICD-10-CM

## 2017-06-25 DIAGNOSIS — E782 Mixed hyperlipidemia: Secondary | ICD-10-CM

## 2017-06-28 ENCOUNTER — Encounter: Payer: Self-pay | Admitting: Cardiovascular Disease

## 2017-06-29 ENCOUNTER — Encounter: Payer: Self-pay | Admitting: Family Medicine

## 2017-07-09 ENCOUNTER — Ambulatory Visit: Payer: Managed Care, Other (non HMO) | Admitting: Family Medicine

## 2017-09-13 ENCOUNTER — Ambulatory Visit: Payer: Managed Care, Other (non HMO) | Admitting: Family Medicine

## 2017-10-15 ENCOUNTER — Ambulatory Visit: Payer: Managed Care, Other (non HMO) | Admitting: Family Medicine

## 2017-10-15 ENCOUNTER — Encounter: Payer: Self-pay | Admitting: Family Medicine

## 2017-10-15 VITALS — BP 130/62 | HR 88 | Temp 98.1°F | Resp 15 | Ht 70.0 in | Wt 198.8 lb

## 2017-10-15 DIAGNOSIS — R7303 Prediabetes: Secondary | ICD-10-CM

## 2017-10-15 DIAGNOSIS — G4733 Obstructive sleep apnea (adult) (pediatric): Secondary | ICD-10-CM

## 2017-10-15 DIAGNOSIS — E782 Mixed hyperlipidemia: Secondary | ICD-10-CM | POA: Diagnosis not present

## 2017-10-15 DIAGNOSIS — M7711 Lateral epicondylitis, right elbow: Secondary | ICD-10-CM

## 2017-10-15 DIAGNOSIS — M7710 Lateral epicondylitis, unspecified elbow: Secondary | ICD-10-CM | POA: Insufficient documentation

## 2017-10-15 DIAGNOSIS — R0789 Other chest pain: Secondary | ICD-10-CM | POA: Diagnosis not present

## 2017-10-15 LAB — POCT GLYCOSYLATED HEMOGLOBIN (HGB A1C): Hemoglobin A1C: 5.4 % (ref 4.0–5.6)

## 2017-10-15 NOTE — Assessment & Plan Note (Addendum)
Well-controlled.  Compliant with CPAP.

## 2017-10-15 NOTE — Assessment & Plan Note (Signed)
Discussed bracing and icing.

## 2017-10-15 NOTE — Assessment & Plan Note (Signed)
Continue current medication.

## 2017-10-15 NOTE — Patient Instructions (Signed)
Nice to see you. We will contact you with your lab work. Please try icing in the brace for your tennis elbow.  Please do not ice it for more than 10 minutes at a time 2-3 times a day.

## 2017-10-15 NOTE — Progress Notes (Signed)
  [name], MD Phone: [phone]  Terry Cameron is a 72 y.o. male who presents today for f/u.  CC: OSA, HLD, prediabetes  HYPERLIPIDEMIA Symptoms Chest pain on exertion:  Much improved from prior   Medications: Compliance- taking lipitor Right upper quadrant pain- no  Muscle aches- no  Patient saw cardiology previously for chest tightness.  He notes the chest tightness has improved significantly and he has had it rarely since seeing them.  He underwent CT coronary calcium scoring which was 39th percentile for age and sex.  OSA: He is using his CPAP 6 to 7 hours a night.  He does wake up well rested.  No hypersomnia.  Prediabetes with A1c 5.7 on last check.  No polyuria or polydipsia.  He does not eat a lot of carbs.  Rare sweet tea.  He does work out 1 hour every day.  He reports right tennis elbow.  Occasionally will feel little tight in the lateral epicondyle area.  Has been using a brace which has been somewhat beneficial.  Has been going on a month.    Social History   Tobacco Use  Smoking Status Former Smoker  . Last attempt to quit: 1974  . Years since quitting: 45.4  Smokeless Tobacco Never Used  Tobacco Comment   SMOKED FOR ABOUT 8 YEARS     ROS see history of present illness  Objective  Physical Exam Vitals:   10/15/17 1344  BP: 130/62  Pulse: 88  Resp: 15  Temp: 98.1 F (36.7 C)  SpO2: 97%    BP Readings from Last 3 Encounters:  10/15/17 130/62  06/14/17 138/60  04/19/17 120/70   Wt Readings from Last 3 Encounters:  10/15/17 198 lb 12.8 oz (90.2 kg)  06/14/17 204 lb 8 oz (92.8 kg)  04/19/17 203 lb (92.1 kg)    Physical Exam  Constitutional: No distress.  Cardiovascular: Normal rate, regular rhythm and normal heart sounds.  Pulmonary/Chest: Effort normal and breath sounds normal.  Musculoskeletal: He exhibits no edema.  Right elbow with slight tenderness, no bony defects, no discomfort on resisted pronation or supination lateral  epicondyle  Neurological: He is alert.  Skin: Skin is warm and dry. He is not diaphoretic.     Assessment/Plan: Please see individual problem list.  Prediabetes Check A1c.  Work on diet and exercise.  Chest tightness This has improved significantly.  Symptoms were atypical.  He had coronary CT scan through cardiology.  If symptoms recur he will need to see cardiology again.  Hyperlipidemia Continue current medication.  OSA (obstructive sleep apnea) Well-controlled.  Compliant with CPAP.  Tennis elbow Discussed bracing and icing.   Orders Placed This Encounter  Procedures  . POCT HgB A1C    No orders of the defined types were placed in this encounter.    [name], MD [place]

## 2017-10-15 NOTE — Assessment & Plan Note (Signed)
This has improved significantly.  Symptoms were atypical.  He had coronary CT scan through cardiology.  If symptoms recur he will need to see cardiology again.

## 2017-10-15 NOTE — Assessment & Plan Note (Signed)
Check A1c.  Work on diet and exercise. 

## 2017-12-16 ENCOUNTER — Other Ambulatory Visit: Payer: Self-pay | Admitting: Family Medicine

## 2018-01-19 ENCOUNTER — Encounter: Payer: Self-pay | Admitting: Family Medicine

## 2018-01-20 ENCOUNTER — Encounter: Payer: Self-pay | Admitting: Family Medicine

## 2018-01-21 NOTE — Telephone Encounter (Signed)
Insurance card received and was scanned into pt chart.

## 2018-02-01 DIAGNOSIS — G4733 Obstructive sleep apnea (adult) (pediatric): Secondary | ICD-10-CM | POA: Diagnosis not present

## 2018-02-21 ENCOUNTER — Ambulatory Visit (INDEPENDENT_AMBULATORY_CARE_PROVIDER_SITE_OTHER): Payer: Medicare HMO

## 2018-02-21 DIAGNOSIS — Z23 Encounter for immunization: Secondary | ICD-10-CM

## 2018-02-21 NOTE — Addendum Note (Signed)
Addended by: Neta Ehlers on: 02/21/2018 04:38 PM   Modules accepted: Orders

## 2018-02-21 NOTE — Progress Notes (Addendum)
Gave Pt Pneumococcal shot in R-arm, Pt tolerated well.

## 2018-02-23 DIAGNOSIS — Z23 Encounter for immunization: Secondary | ICD-10-CM | POA: Diagnosis not present

## 2018-03-01 DIAGNOSIS — H524 Presbyopia: Secondary | ICD-10-CM | POA: Diagnosis not present

## 2018-04-04 DIAGNOSIS — R69 Illness, unspecified: Secondary | ICD-10-CM | POA: Diagnosis not present

## 2018-04-22 ENCOUNTER — Ambulatory Visit: Payer: Managed Care, Other (non HMO) | Admitting: Family Medicine

## 2018-05-17 DIAGNOSIS — G4733 Obstructive sleep apnea (adult) (pediatric): Secondary | ICD-10-CM | POA: Diagnosis not present

## 2018-05-23 DIAGNOSIS — L570 Actinic keratosis: Secondary | ICD-10-CM | POA: Diagnosis not present

## 2018-05-23 DIAGNOSIS — M71341 Other bursal cyst, right hand: Secondary | ICD-10-CM | POA: Diagnosis not present

## 2018-05-23 DIAGNOSIS — D2261 Melanocytic nevi of right upper limb, including shoulder: Secondary | ICD-10-CM | POA: Diagnosis not present

## 2018-05-23 DIAGNOSIS — D2272 Melanocytic nevi of left lower limb, including hip: Secondary | ICD-10-CM | POA: Diagnosis not present

## 2018-05-23 DIAGNOSIS — Z85828 Personal history of other malignant neoplasm of skin: Secondary | ICD-10-CM | POA: Diagnosis not present

## 2018-05-23 DIAGNOSIS — X32XXXA Exposure to sunlight, initial encounter: Secondary | ICD-10-CM | POA: Diagnosis not present

## 2018-05-23 DIAGNOSIS — D2262 Melanocytic nevi of left upper limb, including shoulder: Secondary | ICD-10-CM | POA: Diagnosis not present

## 2018-06-03 ENCOUNTER — Encounter: Payer: Self-pay | Admitting: Family Medicine

## 2018-06-03 ENCOUNTER — Ambulatory Visit (INDEPENDENT_AMBULATORY_CARE_PROVIDER_SITE_OTHER): Payer: Medicare HMO | Admitting: Family Medicine

## 2018-06-03 VITALS — BP 118/60 | HR 73 | Temp 98.0°F | Resp 17 | Ht 70.0 in | Wt 193.1 lb

## 2018-06-03 DIAGNOSIS — N529 Male erectile dysfunction, unspecified: Secondary | ICD-10-CM | POA: Diagnosis not present

## 2018-06-03 DIAGNOSIS — E785 Hyperlipidemia, unspecified: Secondary | ICD-10-CM

## 2018-06-03 DIAGNOSIS — G4733 Obstructive sleep apnea (adult) (pediatric): Secondary | ICD-10-CM | POA: Diagnosis not present

## 2018-06-03 DIAGNOSIS — M65352 Trigger finger, left little finger: Secondary | ICD-10-CM | POA: Diagnosis not present

## 2018-06-03 LAB — LIPID PANEL
Cholesterol: 148 mg/dL (ref 0–200)
HDL: 58.8 mg/dL (ref >39.00)
LDL CALC: 75 mg/dL (ref 0–99)
NONHDL: 89.22
Total CHOL/HDL Ratio: 3
Triglycerides: 72 mg/dL (ref 0.0–149.0)
VLDL: 14.4 mg/dL (ref 0.0–40.0)

## 2018-06-03 LAB — COMPREHENSIVE METABOLIC PANEL
ALBUMIN: 4.2 g/dL (ref 3.5–5.2)
ALK PHOS: 55 U/L (ref 39–117)
ALT: 22 U/L (ref 0–53)
AST: 27 U/L (ref 0–37)
BUN: 17 mg/dL (ref 6–23)
CO2: 24 mEq/L (ref 19–32)
CREATININE: 1.16 mg/dL (ref 0.40–1.50)
Calcium: 9.9 mg/dL (ref 8.4–10.5)
Chloride: 104 mEq/L (ref 96–112)
GFR: 62.06 mL/min (ref >60.00)
Glucose, Bld: 90 mg/dL (ref 70–99)
POTASSIUM: 4.7 meq/L (ref 3.5–5.1)
SODIUM: 137 meq/L (ref 135–145)
TOTAL PROTEIN: 7.2 g/dL (ref 6.0–8.3)
Total Bilirubin: 0.6 mg/dL (ref 0.2–1.2)

## 2018-06-03 NOTE — Assessment & Plan Note (Signed)
Encouraged continued use of CPAP.  Well controlled.

## 2018-06-03 NOTE — Assessment & Plan Note (Signed)
Continue Lipitor.  Check labs.

## 2018-06-03 NOTE — Progress Notes (Signed)
[name], MD Phone: [phone]  Terry Cameron is a 73 y.o. male who presents today for follow-up.  CC: Hyperlipidemia, erectile dysfunction, OSA  Hyperlipidemia: Taking Lipitor.  He notes chronic intermittent chest tightness that is improved from previously.  He was evaluated by cardiology previously.  He underwent cardiac CT which was relatively low risk and no further evaluation was recommended.  He notes this has actually improved from previously.  No right upper quadrant pain or myalgias.  No reflux.  Erectile dysfunction: Patient notes for quite some time he has had issues getting erections.  He will wake up with one occasionally though otherwise not able to get much of an erection.  No pain with erections.  He is able to ejaculate when he does get an erection.  He has not taken medication for this previously.  OSA: He is using his CPAP.  Uses it for 7 to 8 hours a night.  No hypersomnia.  He does wake up well rested.  Left pinky finger issue: Previously evaluated by orthopedics.  He notes there is some difficulty extending it fully.  Social History   Tobacco Use  Smoking Status Former Smoker  . Last attempt to quit: 1974  . Years since quitting: 46.0  Smokeless Tobacco Never Used  Tobacco Comment   SMOKED FOR ABOUT 8 YEARS     ROS see history of present illness  Objective  Physical Exam Vitals:   06/03/18 1343  BP: 118/60  Pulse: 73  Resp: 17  Temp: 98 F (36.7 C)  SpO2: 98%    BP Readings from Last 3 Encounters:  06/03/18 118/60  10/15/17 130/62  06/14/17 138/60   Wt Readings from Last 3 Encounters:  06/03/18 193 lb 2 oz (87.6 kg)  10/15/17 198 lb 12.8 oz (90.2 kg)  06/14/17 204 lb 8 oz (92.8 kg)    Physical Exam Constitutional:      General: He is not in acute distress.    Appearance: He is not diaphoretic.  Cardiovascular:     Rate and Rhythm: Normal rate and regular rhythm.     Heart sounds: Normal heart sounds.  Pulmonary:   Effort: Pulmonary effort is normal.     Breath sounds: Normal breath sounds.  Genitourinary:    Comments: Normal scrotum, normal testicles, normal epididymis, normal vas deferens, no inguinal hernia, normal circumcised penis Musculoskeletal:     Comments: Left little finger with inability to fully extend, no tenderness over the palmar surface, warm and well-perfused  Skin:    General: Skin is warm and dry.  Neurological:     Mental Status: He is alert.      Assessment/Plan: Please see individual problem list.  OSA (obstructive sleep apnea) Encouraged continued use of CPAP.  Well controlled.  Trigger little finger of left hand Referred back to orthopedics.  Hyperlipidemia Continue Lipitor.  Check labs.  Erectile dysfunction Benign exam.  Discussed medication though he declined.  He will monitor.  Health maintenance: Discussed Shingrix.  He will get this at the pharmacy.  Orders Placed This Encounter  Procedures  . Lipid panel  . Comp Met (CMET)  . Ambulatory referral to Orthopedic Surgery    Referral Priority:   Routine    Referral Type:   Surgical    Referral Reason:   Specialty Services Required    Requested Specialty:   Orthopedic Surgery    Number of Visits Requested:   1    No orders of the defined types were placed in this  encounter.    [name], MD [place]

## 2018-06-03 NOTE — Patient Instructions (Signed)
Nice to see you. Please get the Shingrix (shingles vaccine) at your pharmacy. We will get lab work today and contact you with the results.

## 2018-06-03 NOTE — Assessment & Plan Note (Signed)
Referred back to orthopedics.

## 2018-06-03 NOTE — Assessment & Plan Note (Signed)
Benign exam.  Discussed medication though he declined.  He will monitor.

## 2018-07-14 DIAGNOSIS — M25531 Pain in right wrist: Secondary | ICD-10-CM | POA: Diagnosis not present

## 2018-07-14 DIAGNOSIS — M6530 Trigger finger, unspecified finger: Secondary | ICD-10-CM | POA: Insufficient documentation

## 2018-07-14 DIAGNOSIS — M67442 Ganglion, left hand: Secondary | ICD-10-CM | POA: Insufficient documentation

## 2018-07-14 DIAGNOSIS — M720 Palmar fascial fibromatosis [Dupuytren]: Secondary | ICD-10-CM | POA: Diagnosis not present

## 2018-07-21 IMAGING — CT CT HEART SCORING
2 series · 16 of 20 positions shown, 18 images · non-contrast
Comparison: None.

CLINICAL DATA: Risk stratification

EXAM:
Coronary Calcium Score
TECHNIQUE: The patient was scanned on a Siemens Force scanner. Axial
non-contrast 3 mm slices were carried out through the heart. The
data set was analyzed on a dedicated work station and scored using
the Agatson method.

[Series 2: casc 3.0 i36f 2 bestdiast 70 % · axial · 0.45mm/px · z∈[-274,-166]mm · 8 of 48 slices shown, 10 images]
[im 6/48  vessel]
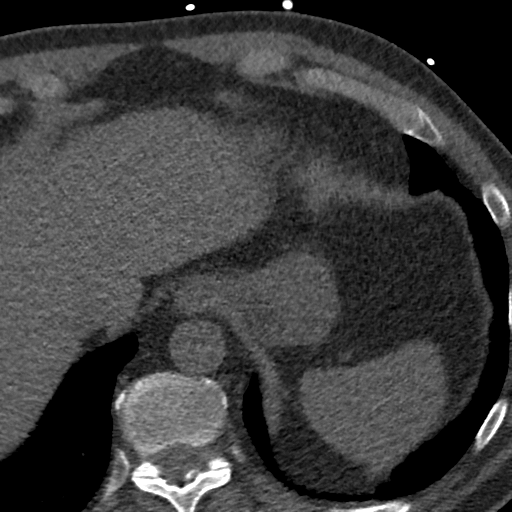
[im 6/48  lung]
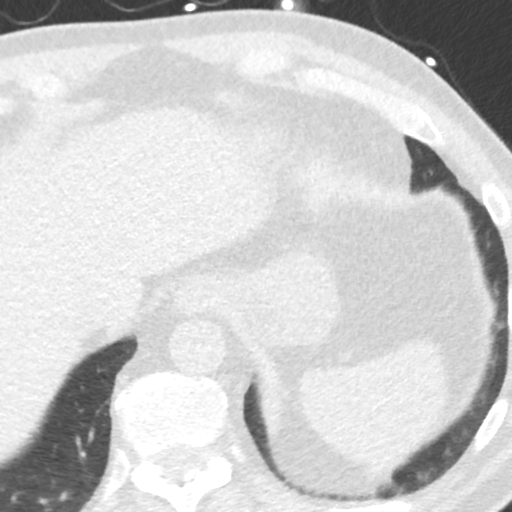
[im 11/48  vessel]
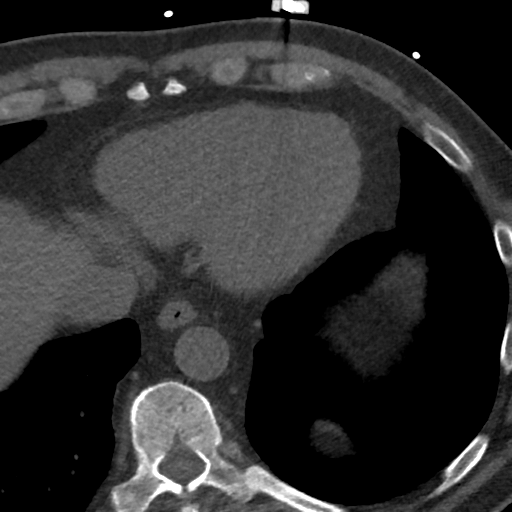
[im 16/48  vessel]
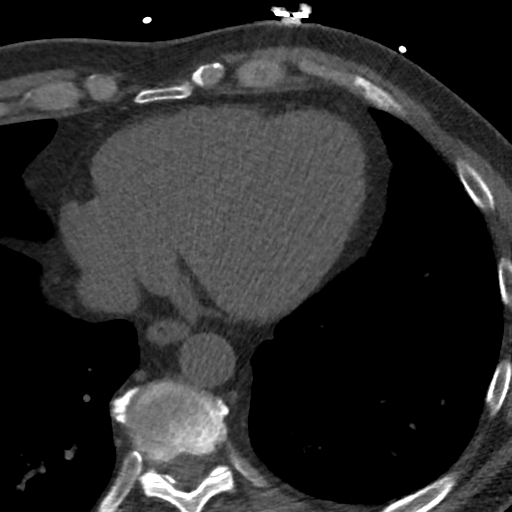
[im 21/48  vessel]
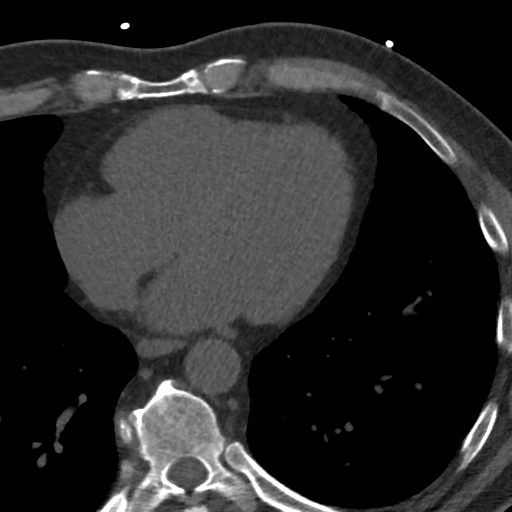
[im 27/48  vessel]
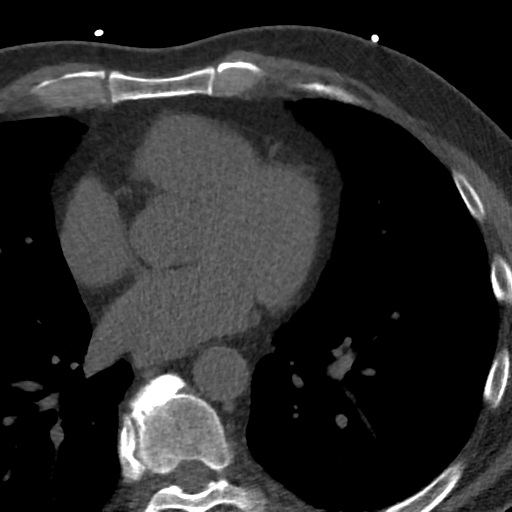
[im 27/48  lung]
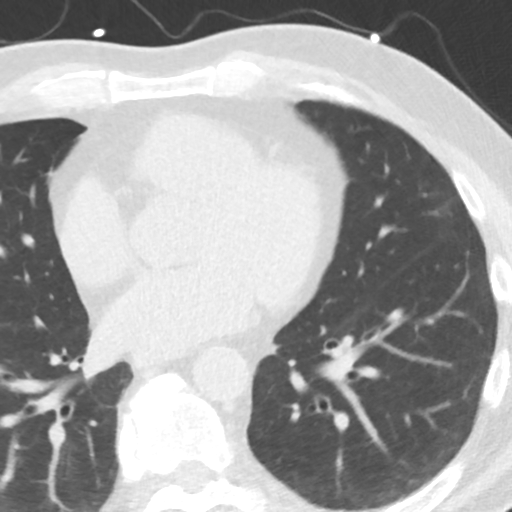
[im 32/48  vessel]
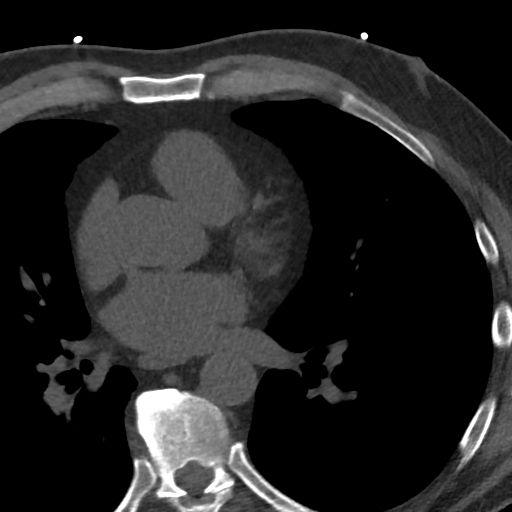
[im 37/48  vessel]
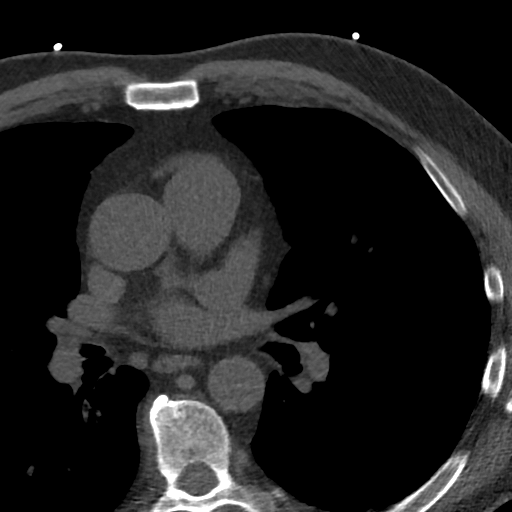
[im 42/48  vessel]
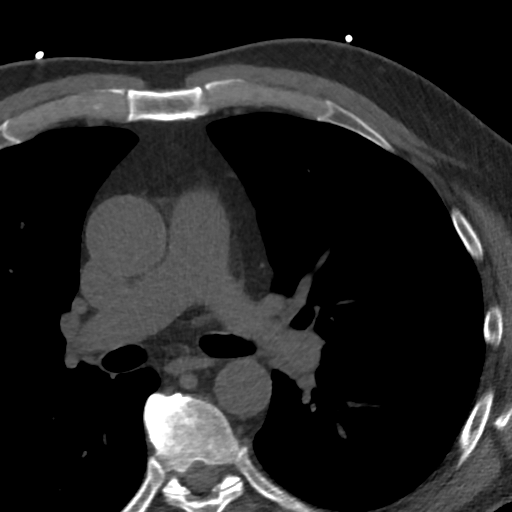

[Series 4: lung st 70 % · axial · 0.72mm/px · z∈[-274,-166]mm · 8 of 48 slices shown]
[im 6/48  lung]
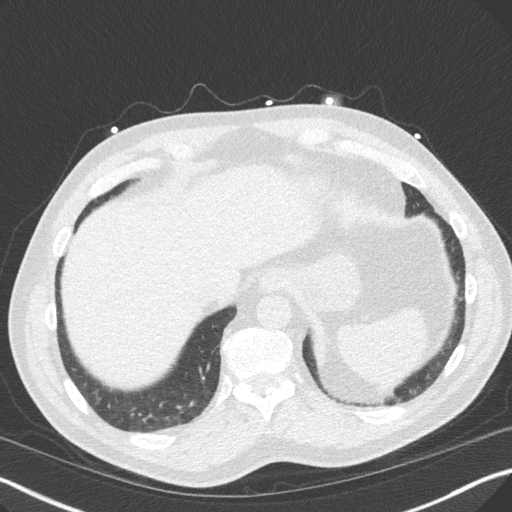
[im 11/48  lung]
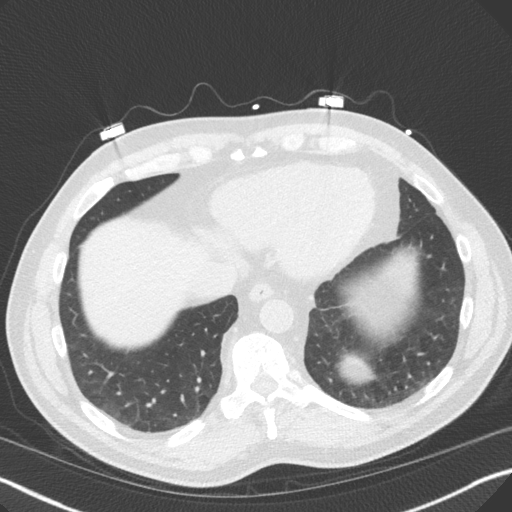
[im 16/48  lung]
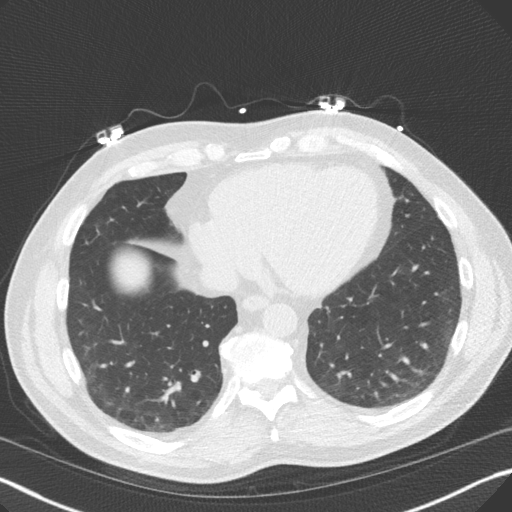
[im 21/48  lung]
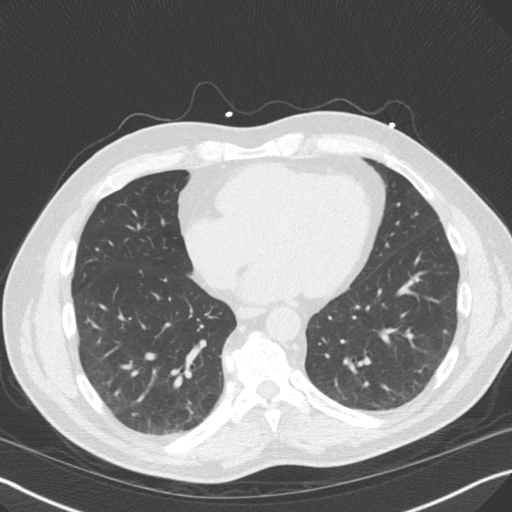
[im 27/48  lung]
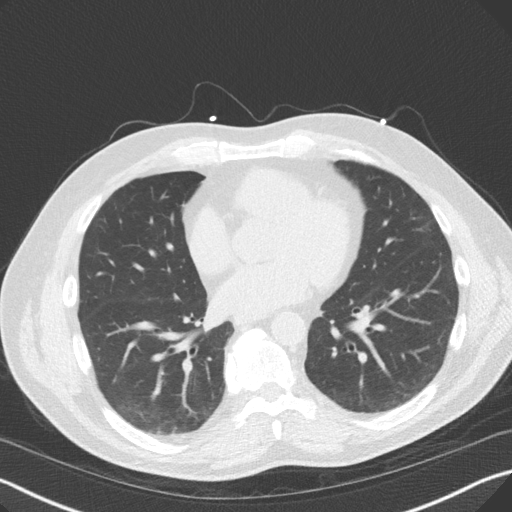
[im 32/48  lung]
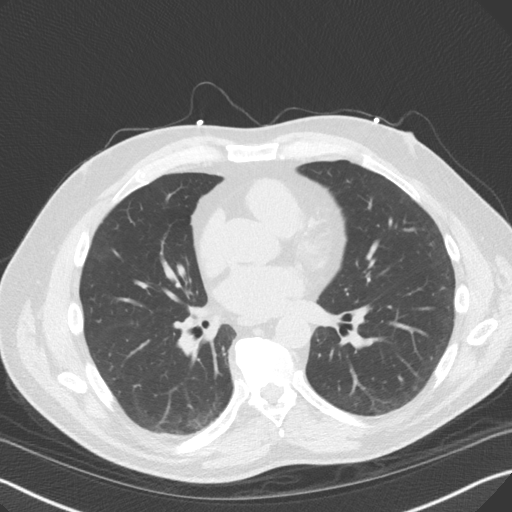
[im 37/48  lung]
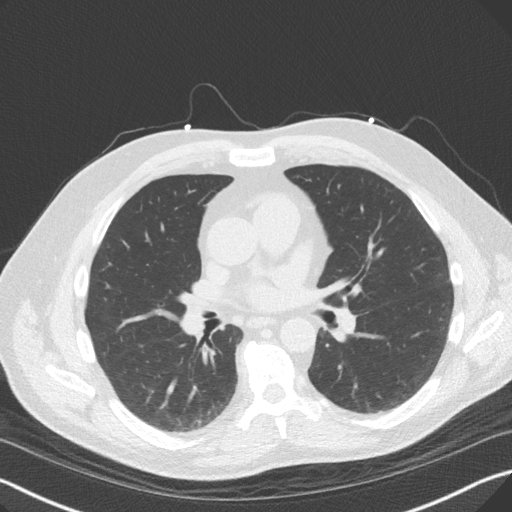
[im 42/48  lung]
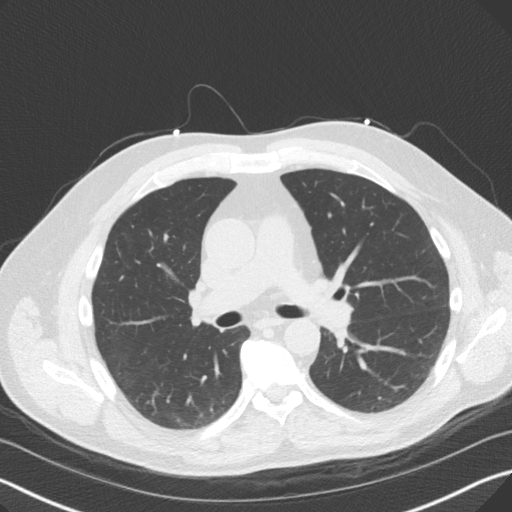

[16 of 20 positions shown; findings below may reference images not displayed]

FINDINGS: Non-cardiac: See separate report from [REDACTED].

Ascending Aorta: Normal size, no calcifications.

Pericardium: Normal.

Coronary arteries: Normal origin.
IMPRESSION: Coronary calcium score of 76. This was 39 percentile for age and sex
matched control.

EXAM:
OVER-READ INTERPRETATION CT CHEST

The following report is an over-read performed by radiologist Dr.
over-read does not include interpretation of cardiac or coronary
anatomy or pathology. The coronary calcium score interpretation by
the cardiologist is attached.
FINDINGS: Vascular: No calcified plaque involving the visualized thoracic and
upper abdominal aorta. No evidence of aortic aneurysm.

Mediastinum/Nodes: No pathologic lymphadenopathy within the
visualized mediastinum. Visualized esophagus normal in appearance.

Lungs/Pleura: Visualized lung parenchyma clear. Central bronchi
patent without significant bronchial wall thickening. No pleural
effusions.

Upper Abdomen: Extreme upper abdomen unremarkable for the unenhanced
technique.

Musculoskeletal: Degenerative changes and DISH involving the
visualized lower thoracic spine.
IMPRESSION: Degenerative changes and DISH involving the lower thoracic spine. No
significant extracardiac findings otherwise.

## 2018-08-29 DIAGNOSIS — G4733 Obstructive sleep apnea (adult) (pediatric): Secondary | ICD-10-CM | POA: Diagnosis not present

## 2018-10-11 DIAGNOSIS — R69 Illness, unspecified: Secondary | ICD-10-CM | POA: Diagnosis not present

## 2018-12-01 DIAGNOSIS — G4733 Obstructive sleep apnea (adult) (pediatric): Secondary | ICD-10-CM | POA: Diagnosis not present

## 2018-12-02 ENCOUNTER — Other Ambulatory Visit: Payer: Self-pay

## 2018-12-02 ENCOUNTER — Encounter: Payer: Self-pay | Admitting: Family Medicine

## 2018-12-02 ENCOUNTER — Ambulatory Visit (INDEPENDENT_AMBULATORY_CARE_PROVIDER_SITE_OTHER): Payer: Medicare HMO | Admitting: Family Medicine

## 2018-12-02 DIAGNOSIS — E782 Mixed hyperlipidemia: Secondary | ICD-10-CM | POA: Diagnosis not present

## 2018-12-02 DIAGNOSIS — G4733 Obstructive sleep apnea (adult) (pediatric): Secondary | ICD-10-CM

## 2018-12-02 DIAGNOSIS — E611 Iron deficiency: Secondary | ICD-10-CM | POA: Diagnosis not present

## 2018-12-02 DIAGNOSIS — R7303 Prediabetes: Secondary | ICD-10-CM | POA: Diagnosis not present

## 2018-12-02 DIAGNOSIS — R238 Other skin changes: Secondary | ICD-10-CM | POA: Insufficient documentation

## 2018-12-02 NOTE — Assessment & Plan Note (Signed)
Check A1c with lab work.  Continue diet and exercise.

## 2018-12-02 NOTE — Assessment & Plan Note (Signed)
Stable.  I encouraged him to contact his CPAP company to see if they can evaluate the humidifier portion of his CPAP.  He will continue CPAP use.

## 2018-12-02 NOTE — Assessment & Plan Note (Signed)
Taking Lipitor.  He will continue this.

## 2018-12-02 NOTE — Progress Notes (Signed)
Virtual Visit via video Note  This visit type was conducted due to national recommendations for restrictions regarding the COVID-19 pandemic (e.g. social distancing).  This format is felt to be most appropriate for this patient at this time.  All issues noted in this document were discussed and addressed.  No physical exam was performed (except for noted visual exam findings with Video Visits).   I connected with Terry Cameron today at  1:15 PM EDT by a video enabled telemedicine application and verified that I am speaking with the correct person using two identifiers. Location patient: home Location provider: work  Persons participating in the virtual visit: patient, provider  I discussed the limitations, risks, security and privacy concerns of performing an evaluation and management service by telephone and the availability of in person appointments. I also discussed with the patient that there may be a patient responsible charge related to this service. The patient expressed understanding and agreed to proceed.  Reason for visit: follow-up  HPI: HYPERLIPIDEMIA Symptoms Chest pain on exertion:  No      Dyspnea:   no Medications: Compliance- taking lipitor Right upper quadrant pain- no  Muscle aches- no  Prediabetes: no polyuria or polydipsia.  Patient's weight is down to the 170s.  He has been exercising by playing pickle ball and walking while playing golf.  He rides his bike as well.  His diet has been very healthy since he has not been able to eat out during the COVID-19 pandemic.  OSA: Using his CPAP nightly for 7-1/2 hours.  He does note his mouth feels dry with using this and he is unsure if the humidifier portion is working.  No hypersomnia.  He does wake well rested.  Iron deficiency: Patient reports his iron was low when he went to donate blood.  He has had no melena.  No hematuria or rectal bleeding.  Skin fragility: Patient notes his skin seems to scrape much easier when  he bumps into things than it did previously.  The bleeding does stop fairly quickly.  He is on aspirin.  No issues with this when he does not bump into anything.   ROS: See pertinent positives and negatives per HPI.  Past Medical History:  Diagnosis Date  . Arthritis   . Chicken pox   . Emphysema of lung ([place])   . Hyperlipidemia   . Sleep apnea     Past Surgical History:  Procedure Laterality Date  . BASAL CELL CARCINOMA EXCISION    . COLONOSCOPY WITH PROPOFOL N/A 02/22/2017   Procedure: COLONOSCOPY WITH PROPOFOL;  Surgeon: [name], MD;  Location: [facility] ENDOSCOPY;  Service: Gastroenterology;  Laterality: N/A;    Family History  Problem Relation Age of Onset  . Hypertension Father   . Heart attack Father   . Diabetes Maternal Grandmother     SOCIAL HX: Former smoker   Current Outpatient Medications:  .  aspirin (ASPIR-81) 81 MG EC tablet, Aspir-81  qd, Disp: , Rfl:  .  atorvastatin (LIPITOR) 10 MG tablet, TAKE 1 TABLET BY MOUTH EVERY DAY, Disp: 90 tablet, Rfl: 3  EXAM:  VITALS per patient if applicable: None.  GENERAL: alert, oriented, appears well and in no acute distress  HEENT: atraumatic, conjunttiva clear, no obvious abnormalities on inspection of external nose and ears  NECK: normal movements of the head and neck  LUNGS: on inspection no signs of respiratory distress, breathing rate appears normal, no obvious gross SOB, gasping or wheezing  CV: no obvious  cyanosis  MS: moves all visible extremities without noticeable abnormality  PSYCH/NEURO: pleasant and cooperative, no obvious depression or anxiety, speech and thought processing grossly intact  ASSESSMENT AND PLAN:  Discussed the following assessment and plan:  OSA (obstructive sleep apnea) Stable.  I encouraged him to contact his CPAP company to see if they can evaluate the humidifier portion of his CPAP.  He will continue CPAP use.  Hyperlipidemia Taking Lipitor.  He will continue this.   Prediabetes Check A1c with lab work.  Continue diet and exercise.  Skin fragility I suspect this is related to his skin thinning as he ages.  He does note some bleeding with this though it stops quickly.  We will check platelets.  Iron deficiency Reported iron deficiency on recent blood donation.  We will check iron studies and a CBC.   Social distancing precautions and sick precautions given regarding COVID-19.   I discussed the assessment and treatment plan with the patient. The patient was provided an opportunity to ask questions and all were answered. The patient agreed with the plan and demonstrated an understanding of the instructions.   The patient was advised to call back or seek an in-person evaluation if the symptoms worsen or if the condition fails to improve as anticipated.   [name], MD

## 2018-12-02 NOTE — Assessment & Plan Note (Signed)
Reported iron deficiency on recent blood donation.  We will check iron studies and a CBC.

## 2018-12-02 NOTE — Assessment & Plan Note (Signed)
I suspect this is related to his skin thinning as he ages.  He does note some bleeding with this though it stops quickly.  We will check platelets.

## 2018-12-04 ENCOUNTER — Encounter: Payer: Self-pay | Admitting: Family Medicine

## 2018-12-06 DIAGNOSIS — C44319 Basal cell carcinoma of skin of other parts of face: Secondary | ICD-10-CM | POA: Diagnosis not present

## 2018-12-06 DIAGNOSIS — L821 Other seborrheic keratosis: Secondary | ICD-10-CM | POA: Diagnosis not present

## 2018-12-06 DIAGNOSIS — D485 Neoplasm of uncertain behavior of skin: Secondary | ICD-10-CM | POA: Diagnosis not present

## 2018-12-06 DIAGNOSIS — D225 Melanocytic nevi of trunk: Secondary | ICD-10-CM | POA: Diagnosis not present

## 2018-12-06 DIAGNOSIS — L570 Actinic keratosis: Secondary | ICD-10-CM | POA: Diagnosis not present

## 2018-12-06 DIAGNOSIS — X32XXXA Exposure to sunlight, initial encounter: Secondary | ICD-10-CM | POA: Diagnosis not present

## 2018-12-15 ENCOUNTER — Other Ambulatory Visit (INDEPENDENT_AMBULATORY_CARE_PROVIDER_SITE_OTHER): Payer: Medicare HMO

## 2018-12-15 ENCOUNTER — Other Ambulatory Visit: Payer: Self-pay

## 2018-12-15 DIAGNOSIS — R7303 Prediabetes: Secondary | ICD-10-CM | POA: Diagnosis not present

## 2018-12-15 DIAGNOSIS — E611 Iron deficiency: Secondary | ICD-10-CM

## 2018-12-15 LAB — CBC
HCT: 38.1 % — ABNORMAL LOW (ref 39.0–52.0)
Hemoglobin: 12.5 g/dL — ABNORMAL LOW (ref 13.0–17.0)
MCHC: 32.8 g/dL (ref 30.0–36.0)
MCV: 96.6 fl (ref 78.0–100.0)
Platelets: 235 10*3/uL (ref 150.0–400.0)
RBC: 3.95 Mil/uL — ABNORMAL LOW (ref 4.22–5.81)
RDW: 14 % (ref 11.5–15.5)
WBC: 7.9 10*3/uL (ref 4.0–10.5)

## 2018-12-15 LAB — IBC + FERRITIN
Ferritin: 113 ng/mL (ref 22.0–322.0)
Iron: 95 ug/dL (ref 42–165)
Saturation Ratios: 29.2 % (ref 20.0–50.0)
Transferrin: 232 mg/dL (ref 212.0–360.0)

## 2018-12-15 LAB — HEMOGLOBIN A1C: Hgb A1c MFr Bld: 5.9 % (ref 4.6–6.5)

## 2018-12-19 ENCOUNTER — Encounter: Payer: Self-pay | Admitting: Family Medicine

## 2018-12-20 ENCOUNTER — Other Ambulatory Visit: Payer: Self-pay

## 2018-12-20 ENCOUNTER — Encounter: Payer: Self-pay | Admitting: Family Medicine

## 2018-12-20 ENCOUNTER — Ambulatory Visit: Payer: Medicare HMO | Admitting: Family Medicine

## 2018-12-20 ENCOUNTER — Ambulatory Visit (INDEPENDENT_AMBULATORY_CARE_PROVIDER_SITE_OTHER): Payer: Medicare HMO | Admitting: Family Medicine

## 2018-12-20 VITALS — BP 110/58 | HR 71 | Temp 97.6°F | Resp 16 | Ht 70.0 in | Wt 182.0 lb

## 2018-12-20 DIAGNOSIS — M545 Low back pain, unspecified: Secondary | ICD-10-CM

## 2018-12-20 MED ORDER — PREDNISONE 10 MG (21) PO TBPK: ORAL_TABLET | ORAL | 0 refills | Status: DC

## 2018-12-20 MED ORDER — KETOROLAC TROMETHAMINE 60 MG/2ML IM SOLN
60.0000 mg | Freq: Once | INTRAMUSCULAR | Status: AC
  Administered 2018-12-20: 60 mg via INTRAMUSCULAR

## 2018-12-20 MED ORDER — TIZANIDINE HCL 4 MG PO TABS: 4.0000 mg | ORAL_TABLET | Freq: Four times a day (QID) | ORAL | 1 refills | Status: DC | PRN

## 2018-12-20 MED ORDER — METHYLPREDNISOLONE ACETATE 80 MG/ML IJ SUSP
80.0000 mg | Freq: Once | INTRAMUSCULAR | Status: AC
  Administered 2018-12-20: 80 mg via INTRAMUSCULAR

## 2018-12-20 NOTE — Patient Instructions (Signed)

## 2018-12-20 NOTE — Progress Notes (Signed)
Subjective:    Patient ID: Terry Cameron, male    DOB: July 11, 1945, 73 y.o.   MRN: [id number]  HPI   Patient presents to clinic complaining of right-sided low back pain.  Patient has hurt his back in the past similarly so he had some leftover tizanidine.  Took tizanidine, did help somewhat to reduce pain however does notice pain especially when bending forward or towards the right side.  He is not 100% sure how he injured back, but was lifting boxes in his garage and suspects he pulled something while doing that.  Denies fever or chills.  Denies saddle anesthesia or bilateral lower extremity numbness.  Denies right-sided leg numbness.  Denies loss of bowel or bladder control.  Patient Active Problem List   Diagnosis Date Noted  . Skin fragility 12/02/2018  . Iron deficiency 12/02/2018  . Trigger little finger of left hand 06/03/2018  . Erectile dysfunction 06/03/2018  . Prediabetes 10/15/2017  . Tennis elbow 10/15/2017  . Chest tightness 06/12/2017  . Hyperlipidemia 01/01/2017  . OSA (obstructive sleep apnea) 01/01/2017  . Colon cancer screening 01/01/2017  . 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 08/29/2015  . Low back pain 08/08/2015   Social History   Tobacco Use  . Smoking status: Former Smoker    Quit date: 1974    Years since quitting: 46.6  . Smokeless tobacco: Never Used  . Tobacco comment: SMOKED FOR ABOUT 8 YEARS  Substance Use Topics  . Alcohol use: Yes    Comment: COUPLE TIMES A WEEK      Review of Systems   Constitutional: Negative for chills, fatigue and fever.  HENT: Negative for congestion, ear pain, sinus pain and sore throat.   Eyes: Negative.   Respiratory: Negative for cough, shortness of breath and wheezing.   Cardiovascular: Negative for chest pain, palpitations and leg swelling.  Gastrointestinal: Negative for abdominal pain, diarrhea, nausea and vomiting.  Genitourinary: Negative for dysuria, frequency and urgency.  Musculoskeletal:  +right low back pain Skin: Negative for color change, pallor and rash.  Neurological: Negative for syncope, light-headedness and headaches.  Psychiatric/Behavioral: The patient is not nervous/anxious.       Objective:   Physical Exam Vitals signs and nursing note reviewed.  Constitutional:      General: He is not in acute distress.    Appearance: He is not ill-appearing, toxic-appearing or diaphoretic.  HENT:     Head: Normocephalic and atraumatic.  Cardiovascular:     Rate and Rhythm: Normal rate and regular rhythm.  Pulmonary:     Effort: Pulmonary effort is normal. No respiratory distress.     Breath sounds: Normal breath sounds.  Musculoskeletal:        General: Tenderness present.       Back:     Comments: Area of low back tenderness indicated by red square on diagram.  Patient is able to bend forward backward, side to side and twist side to side however these motions do cause a pulling type pain.  Quadricep strength equal and strong.  No additional pain with straight leg raises.  Skin:    General: Skin is warm and dry.     Coloration: Skin is not pale.  Neurological:     Mental Status: He is alert and oriented to person, place, and time.     Cranial Nerves: No cranial nerve deficit.     Gait: Gait normal.  Psychiatric:        Mood and Affect: Mood  normal.        Behavior: Behavior normal.    Vitals:   12/20/18 1349  BP: (!) 110/58  Pulse: 71  Resp: 16  Temp: 97.6 F (36.4 C)  SpO2: 97%      Assessment & Plan:    Right-sided low back pain - suspect patient strained or sprained muscles in low back while doing lifting in his garage.  We will give him IM methylprednisolone and Toradol x1 to help kick start pain relief and he will take oral steroid taper down and use tizanidine tablets as needed for pain relief as well.  Discussed stretching and range of motion exercises he can do to help reduce pain and also recommended topical rubs like BenGay, Biofreeze or heating  pad on sore area of low back.  Advised if pain persists or worsens to call office and let us know, otherwise patient will keep regularly scheduled follow-up with PCP as planned and return to clinic anytime if issues arise.

## 2018-12-29 ENCOUNTER — Encounter: Payer: Self-pay | Admitting: Family Medicine

## 2019-01-26 DIAGNOSIS — C44319 Basal cell carcinoma of skin of other parts of face: Secondary | ICD-10-CM | POA: Diagnosis not present

## 2019-01-29 DIAGNOSIS — R69 Illness, unspecified: Secondary | ICD-10-CM | POA: Diagnosis not present

## 2019-01-30 DIAGNOSIS — G4733 Obstructive sleep apnea (adult) (pediatric): Secondary | ICD-10-CM | POA: Diagnosis not present

## 2019-01-31 ENCOUNTER — Encounter: Payer: Self-pay | Admitting: Family Medicine

## 2019-02-01 ENCOUNTER — Ambulatory Visit (INDEPENDENT_AMBULATORY_CARE_PROVIDER_SITE_OTHER): Payer: Medicare HMO

## 2019-02-01 ENCOUNTER — Other Ambulatory Visit: Payer: Self-pay

## 2019-02-01 DIAGNOSIS — Z0000 Encounter for general adult medical examination without abnormal findings: Secondary | ICD-10-CM | POA: Diagnosis not present

## 2019-02-01 NOTE — Progress Notes (Signed)
Subjective:   Terry Cameron is a 73 y.o. male who presents for an Initial Medicare Annual Wellness Visit.  Review of Systems  No ROS.  Medicare Wellness Virtual Visit.  Visual/audio telehealth visit, UTA vital signs.   See social history for additional risk factors.   Cardiac Risk Factors include: advanced age (>29men, >48 women);male gender    Objective:    Today's Vitals   There is no height or weight on file to calculate BMI.  Advanced Directives 02/01/2019  Does Patient Have a Medical Advance Directive? Yes  Type of Advance Directive Living will  Does patient want to make changes to medical advance directive? No - Patient declined    Current Medications (verified) Outpatient Encounter Medications as of 02/01/2019  Medication Sig  . aspirin (ASPIR-81) 81 MG EC tablet Aspir-81  qd  . atorvastatin (LIPITOR) 10 MG tablet TAKE 1 TABLET BY MOUTH EVERY DAY  . predniSONE (STERAPRED UNI-PAK 21 TAB) 10 MG (21) TBPK tablet Take according to pack instructions  . tiZANidine (ZANAFLEX) 4 MG tablet Take 1 tablet (4 mg total) by mouth every 6 (six) hours as needed for muscle spasms.   No facility-administered encounter medications on file as of 02/01/2019.     Allergies (verified) Patient has no known allergies.   History: Past Medical History:  Diagnosis Date  . Arthritis   . Chicken pox   . Emphysema of lung ([place])   . Hyperlipidemia   . Sleep apnea    Past Surgical History:  Procedure Laterality Date  . BASAL CELL CARCINOMA EXCISION    . COLONOSCOPY WITH PROPOFOL N/A 02/22/2017   Procedure: COLONOSCOPY WITH PROPOFOL;  Surgeon: [name], MD;  Location: [facility] ENDOSCOPY;  Service: Gastroenterology;  Laterality: N/A;   Family History  Problem Relation Age of Onset  . Hypertension Father   . Heart attack Father   . Diabetes Maternal Grandmother   . Glaucoma Maternal Grandmother   . Arthritis Mother    Social History   Socioeconomic History  . Marital status:  Married    Spouse name: Not on file  . Number of children: Not on file  . Years of education: Not on file  . Highest education level: Not on file  Occupational History  . Not on file  Social Needs  . Financial resource strain: Not hard at all  . Food insecurity    Worry: Never true    Inability: Never true  . Transportation needs    Medical: No    Non-medical: No  Tobacco Use  . Smoking status: Former Smoker    Quit date: 1974    Years since quitting: 46.7  . Smokeless tobacco: Never Used  . Tobacco comment: SMOKED FOR ABOUT 8 YEARS  Substance and Sexual Activity  . Alcohol use: Yes    Comment: COUPLE TIMES A WEEK   . Drug use: No  . Sexual activity: Not on file  Lifestyle  . Physical activity    Days per week: 4 days    Minutes per session: 30 min  . Stress: Not at all  Relationships  . Social Herbalist on phone: Not on file    Gets together: Not on file    Attends religious service: Not on file    Active member of club or organization: Not on file    Attends meetings of clubs or organizations: Not on file    Relationship status: Not on file  Other Topics Concern  . Not  on file  Social History Narrative  . Not on file   Tobacco Counseling Counseling given: Not Answered Comment: SMOKED FOR ABOUT 8 YEARS   Clinical Intake:  Pre-visit preparation completed: Yes        Diabetes: No  How often do you need to have someone help you when you read instructions, pamphlets, or other written materials from your doctor or pharmacy?: 1 - Never  Interpreter Needed?: No     Activities of Daily Living In your present state of health, do you have any difficulty performing the following activities: 02/01/2019  Hearing? N  Vision? N  Difficulty concentrating or making decisions? N  Walking or climbing stairs? N  Dressing or bathing? N  Doing errands, shopping? N  Preparing Food and eating ? N  Using the Toilet? N  In the past six months, have you  accidently leaked urine? N  Do you have problems with loss of bowel control? N  Managing your Medications? N  Managing your Finances? N  Housekeeping or managing your Housekeeping? N  Some recent data might be hidden     Immunizations and Health Maintenance Immunization History  Administered Date(s) Administered  . Influenza, High Dose Seasonal PF 03/05/2017, 02/21/2018  . Influenza,inj,Quad PF,6+ Mos 01/29/2019  . Pneumococcal Conjugate-13 01/01/2017  . Pneumococcal Polysaccharide-23 02/23/2018  . Tdap 01/01/2017   There are no preventive care reminders to display for this patient.  Patient Care Team: Leone Haven, MD as PCP - General (Family Medicine)  Indicate any recent Medical Services you may have received from other than [facility] providers in the past year (date may be approximate).    Assessment:   This is a routine wellness examination for [place].  I connected with patient 02/01/19 at  8:30 AM EDT by an audio enabled telemedicine application and verified that I am speaking with the correct person using two identifiers. Patient stated full name and DOB. Patient gave permission to continue with virtual visit. Patient's location was at home and Nurse's location was at [place] office.   Health Maintenance Due: See completed HM at the end of note.   Eye: Visual acuity not assessed. Virtual visit. Wears corrective lenses. Followed by their ophthalmologist every 12 months.   Dental: Visits every 6 months.    Hearing: Demonstrates normal hearing during visit.  Safety:  Patient feels safe at home- yes Patient does have smoke detectors at home- yes Patient does wear sunscreen or protective clothing when in direct sunlight - yes Patient does wear seat belt when in a moving vehicle - yes Patient drives- yes Adequate lighting in walkways free from debris- yes Grab bars and handrails used as appropriate- yes Ambulates with on assistive device Cell phone on person when  ambulating outside of the home- yes  Social: Alcohol intake - yes      Smoking history- former Smokers in home? none Illicit drug use? none  Depression: PHQ 2 &9 complete. See screening below. Denies irritability, anhedonia, sadness/tearfullness.  Stable.   Falls: See screening below.    Medication: Taking as directed and without issues.   Covid-19: Precautions and sickness symptoms discussed. Wears mask, social distancing, hand hygiene as appropriate.   Activities of Daily Living Patient denies needing assistance with: household chores, feeding themselves, getting from bed to chair, getting to the toilet, bathing/showering, dressing, managing money, or preparing meals.   Memory: Patient is alert. Patient denies difficulty focusing or concentrating. Correctly identified the president of the [place], season and recall. Patient  likes to read, play sodoku, and completes puzzles for brain stimulation.  BMI- discussed the importance of a healthy diet, water intake and the benefits of aerobic exercise.  Educational material provided.  Physical activity- walking, stretching with bands, pickle ball 3-4 times weekly, 30 minutes.  Diet: Regular Water: good intake Caffeine: 2 cups of coffee  Advanced Directive: End of life planning; Advance aging; Advanced directives discussed.  Copy of current HCPOA/Living Will requested.    Other Providers Patient Care Team: Leone Haven, MD as PCP - General (Family Medicine)  Hearing/Vision screen  Hearing Screening   125Hz  250Hz  500Hz  1000Hz  2000Hz  3000Hz  4000Hz  6000Hz  8000Hz   Right ear:           Left ear:           Comments: Patient is able to hear conversational tones without difficulty.  No issues reported.    Vision Screening Comments: Wears corrective lenses Visual acuity not assessed, virtual visit.  They have seen their ophthalmologist in the last 12 months.     Dietary issues and exercise activities discussed: Current  Exercise Habits: Home exercise routine, Intensity: Moderate  Goals      Patient Stated   . Increase physical activity (pt-stated)     Resume gym exercises Monitor diet      Depression Screen PHQ 2/9 Scores 02/01/2019 12/02/2018 02/12/2017  PHQ - 2 Score 0 0 0  PHQ- 9 Score - 0 -    Fall Risk Fall Risk  02/01/2019 12/02/2018 02/12/2017  Falls in the past year? 0 0 No  Number falls in past yr: - 0 -  Injury with Fall? - 0 -  Follow up - Falls evaluation completed -    Timed Get Up and Go performed: no, virtual visit  Cognitive Function:     6CIT Screen 02/01/2019  What Year? 0 points  What month? 0 points  What time? 0 points  Count back from 20 0 points  Months in reverse 0 points  Repeat phrase 0 points  Total Score 0    Screening Tests Health Maintenance  Topic Date Due  . COLONOSCOPY  02/23/2020  . TETANUS/TDAP  01/02/2027  . INFLUENZA VACCINE  Completed  . Hepatitis C Screening  Completed  . PNA vac Low Risk Adult  Completed      Plan:   Keep all routine maintenance appointments.   6 month follow up 06/09/19  Medicare Attestation I have personally reviewed: The patient's medical and social history Their use of alcohol, tobacco or illicit drugs Their current medications and supplements The patient's functional ability including ADLs,fall risks, home safety risks, cognitive, and hearing and visual impairment Diet and physical activities Evidence for depression   In addition, I have reviewed and discussed with patient certain preventive protocols, quality metrics, and best practice recommendations. A written personalized care plan for preventive services as well as general preventive health recommendations were provided to patient via mail.     Varney Biles, LPN   [id number]

## 2019-02-01 NOTE — Patient Instructions (Addendum)
  Terry Cameron , Thank you for taking time to come for your Medicare Wellness Visit. I appreciate your ongoing commitment to your health goals. Please review the following plan we discussed and let me know if I can assist you in the future.   These are the goals we discussed: Goals      Patient Stated   . Increase physical activity (pt-stated)     Resume gym exercises Monitor diet       This is a list of the screening recommended for you and due dates:  Health Maintenance  Topic Date Due  . Colon Cancer Screening  02/23/2020  . Tetanus Vaccine  01/02/2027  . Flu Shot  Completed  .  Hepatitis C: One time screening is recommended by Center for Disease Control  (CDC) for  adults born from [birth year] through 1963.   Completed  . Pneumonia vaccines  Completed

## 2019-02-04 ENCOUNTER — Other Ambulatory Visit: Payer: Self-pay | Admitting: Family Medicine

## 2019-03-01 DIAGNOSIS — G4733 Obstructive sleep apnea (adult) (pediatric): Secondary | ICD-10-CM | POA: Diagnosis not present

## 2019-03-23 DIAGNOSIS — H524 Presbyopia: Secondary | ICD-10-CM | POA: Diagnosis not present

## 2019-04-01 DIAGNOSIS — G4733 Obstructive sleep apnea (adult) (pediatric): Secondary | ICD-10-CM | POA: Diagnosis not present

## 2019-04-05 DIAGNOSIS — Z0100 Encounter for examination of eyes and vision without abnormal findings: Secondary | ICD-10-CM | POA: Diagnosis not present

## 2019-04-18 DIAGNOSIS — R69 Illness, unspecified: Secondary | ICD-10-CM | POA: Diagnosis not present

## 2019-05-02 DIAGNOSIS — G4733 Obstructive sleep apnea (adult) (pediatric): Secondary | ICD-10-CM | POA: Diagnosis not present

## 2019-06-02 DIAGNOSIS — G4733 Obstructive sleep apnea (adult) (pediatric): Secondary | ICD-10-CM | POA: Diagnosis not present

## 2019-06-07 ENCOUNTER — Encounter: Payer: Self-pay | Admitting: Family Medicine

## 2019-06-09 ENCOUNTER — Encounter: Payer: Self-pay | Admitting: Family Medicine

## 2019-06-09 ENCOUNTER — Ambulatory Visit (INDEPENDENT_AMBULATORY_CARE_PROVIDER_SITE_OTHER): Payer: Medicare HMO | Admitting: Family Medicine

## 2019-06-09 ENCOUNTER — Other Ambulatory Visit: Payer: Self-pay

## 2019-06-09 DIAGNOSIS — H9313 Tinnitus, bilateral: Secondary | ICD-10-CM

## 2019-06-09 DIAGNOSIS — R7303 Prediabetes: Secondary | ICD-10-CM

## 2019-06-09 DIAGNOSIS — G4733 Obstructive sleep apnea (adult) (pediatric): Secondary | ICD-10-CM | POA: Diagnosis not present

## 2019-06-09 DIAGNOSIS — H9319 Tinnitus, unspecified ear: Secondary | ICD-10-CM | POA: Insufficient documentation

## 2019-06-09 DIAGNOSIS — E782 Mixed hyperlipidemia: Secondary | ICD-10-CM

## 2019-06-09 NOTE — Assessment & Plan Note (Signed)
Seems to be well controlled.  We will request a compliance report.  He will continue his CPAP.

## 2019-06-09 NOTE — Assessment & Plan Note (Signed)
Continue Lipitor.  We will have him in for labs

## 2019-06-09 NOTE — Progress Notes (Signed)
Called [facility] and they are faxing the complaince report to me today.  Charli Halle,cma

## 2019-06-09 NOTE — Assessment & Plan Note (Addendum)
Discussed coming off of his aspirin.  Discussed that the guidelines regarding aspirin would indicate that he could come off of this given risk of bleeding.  Advised aspirin could be contributing to tinnitus.  Discussed that we could refer him to somebody to check his hearing as well.  He defers that currently.

## 2019-06-09 NOTE — Assessment & Plan Note (Signed)
Check A1c. 

## 2019-06-09 NOTE — Progress Notes (Signed)
Virtual Visit via video Note  This visit type was conducted due to national recommendations for restrictions regarding the COVID-19 pandemic (e.g. social distancing).  This format is felt to be most appropriate for this patient at this time.  All issues noted in this document were discussed and addressed.  No physical exam was performed (except for noted visual exam findings with Video Visits).   I connected with Cy Blamer today at  9:30 AM EST by a video enabled telemedicine application and verified that I am speaking with the correct person using two identifiers. Location patient: home Location provider: work Persons participating in the virtual visit: patient, provider  I discussed the limitations, risks, security and privacy concerns of performing an evaluation and management service by telephone and the availability of in person appointments. I also discussed with the patient that there may be a patient responsible charge related to this service. The patient expressed understanding and agreed to proceed.  Reason for visit: follow-up  HPI: HYPERLIPIDEMIA Symptoms Chest pain on exertion:  no   Leg claudication:   no Medications: Compliance- taking lipitor Right upper quadrant pain- no  Muscle aches- no  OSA: Using his CPAP.  No hypersomnia.  He does mostly wake well rested.  He is wearing his CPAP nightly.  He uses [facility].  Tinnitus: Notes it is bilateral.  He is unsure when it started.  It is a light ringing.  No hearing loss.  No ear fullness.  He does take aspirin.     ROS: See pertinent positives and negatives per HPI.  Past Medical History:  Diagnosis Date  . Arthritis   . Chicken pox   . Emphysema of lung ([place])   . Hyperlipidemia   . Sleep apnea     Past Surgical History:  Procedure Laterality Date  . BASAL CELL CARCINOMA EXCISION    . COLONOSCOPY WITH PROPOFOL N/A 02/22/2017   Procedure: COLONOSCOPY WITH PROPOFOL;  Surgeon: [name], MD;  Location:  [facility] ENDOSCOPY;  Service: Gastroenterology;  Laterality: N/A;    Family History  Problem Relation Age of Onset  . Hypertension Father   . Heart attack Father   . Diabetes Maternal Grandmother   . Glaucoma Maternal Grandmother   . Arthritis Mother     SOCIAL HX: Former smoker   Current Outpatient Medications:  .  aspirin (ASPIR-81) 81 MG EC tablet, Aspir-81  qd, Disp: , Rfl:  .  atorvastatin (LIPITOR) 10 MG tablet, TAKE 1 TABLET BY MOUTH EVERY DAY, Disp: 90 tablet, Rfl: 3  EXAM:  VITALS per patient if applicable:  GENERAL: alert, oriented, appears well and in no acute distress  HEENT: atraumatic, conjunttiva clear, no obvious abnormalities on inspection of external nose and ears  NECK: normal movements of the head and neck  LUNGS: on inspection no signs of respiratory distress, breathing rate appears normal, no obvious gross SOB, gasping or wheezing  CV: no obvious cyanosis  MS: moves all visible extremities without noticeable abnormality  PSYCH/NEURO: pleasant and cooperative, no obvious depression or anxiety, speech and thought processing grossly intact  ASSESSMENT AND PLAN:  Discussed the following assessment and plan:  OSA (obstructive sleep apnea) Seems to be well controlled.  We will request a compliance report.  He will continue his CPAP.  Hyperlipidemia Continue Lipitor.  We will have him in for labs  Tinnitus Discussed coming off of his aspirin.  Discussed that the guidelines regarding aspirin would indicate that he could come off of this given risk  of bleeding.  Advised aspirin could be contributing to tinnitus.  Discussed that we could refer him to somebody to check his hearing as well.  He defers that currently.  Prediabetes Check A1c.   Orders Placed This Encounter  Procedures  . Comp Met (CMET)    Standing Status:   Future    Standing Expiration Date:   06/08/2020  . Lipid panel    Standing Status:   Future    Standing Expiration Date:    06/08/2020  . HgB A1c    Standing Status:   Future    Standing Expiration Date:   06/08/2020    No orders of the defined types were placed in this encounter.    I discussed the assessment and treatment plan with the patient. The patient was provided an opportunity to ask questions and all were answered. The patient agreed with the plan and demonstrated an understanding of the instructions.   The patient was advised to call back or seek an in-person evaluation if the symptoms worsen or if the condition fails to improve as anticipated.    [name], MD

## 2019-06-12 DIAGNOSIS — L821 Other seborrheic keratosis: Secondary | ICD-10-CM | POA: Diagnosis not present

## 2019-06-12 DIAGNOSIS — X32XXXA Exposure to sunlight, initial encounter: Secondary | ICD-10-CM | POA: Diagnosis not present

## 2019-06-12 DIAGNOSIS — D225 Melanocytic nevi of trunk: Secondary | ICD-10-CM | POA: Diagnosis not present

## 2019-06-12 DIAGNOSIS — L570 Actinic keratosis: Secondary | ICD-10-CM | POA: Diagnosis not present

## 2019-06-12 DIAGNOSIS — D2261 Melanocytic nevi of right upper limb, including shoulder: Secondary | ICD-10-CM | POA: Diagnosis not present

## 2019-06-12 DIAGNOSIS — D2272 Melanocytic nevi of left lower limb, including hip: Secondary | ICD-10-CM | POA: Diagnosis not present

## 2019-06-12 DIAGNOSIS — D2262 Melanocytic nevi of left upper limb, including shoulder: Secondary | ICD-10-CM | POA: Diagnosis not present

## 2019-06-12 DIAGNOSIS — Z85828 Personal history of other malignant neoplasm of skin: Secondary | ICD-10-CM | POA: Diagnosis not present

## 2019-07-03 DIAGNOSIS — G4733 Obstructive sleep apnea (adult) (pediatric): Secondary | ICD-10-CM | POA: Diagnosis not present

## 2019-07-13 ENCOUNTER — Encounter: Payer: Self-pay | Admitting: Family Medicine

## 2019-07-13 ENCOUNTER — Other Ambulatory Visit (INDEPENDENT_AMBULATORY_CARE_PROVIDER_SITE_OTHER): Payer: Medicare HMO

## 2019-07-13 ENCOUNTER — Other Ambulatory Visit: Payer: Self-pay

## 2019-07-13 DIAGNOSIS — E782 Mixed hyperlipidemia: Secondary | ICD-10-CM

## 2019-07-13 DIAGNOSIS — R7303 Prediabetes: Secondary | ICD-10-CM

## 2019-07-13 LAB — COMPREHENSIVE METABOLIC PANEL
ALT: 22 U/L (ref 0–53)
AST: 26 U/L (ref 0–37)
Albumin: 4.1 g/dL (ref 3.5–5.2)
Alkaline Phosphatase: 69 U/L (ref 39–117)
BUN: 23 mg/dL (ref 6–23)
CO2: 28 mEq/L (ref 19–32)
Calcium: 9.4 mg/dL (ref 8.4–10.5)
Chloride: 103 mEq/L (ref 96–112)
Creatinine, Ser: 1.08 mg/dL (ref 0.40–1.50)
GFR: 67.18 mL/min (ref >60.00)
Glucose, Bld: 97 mg/dL (ref 70–99)
Potassium: 4.2 mEq/L (ref 3.5–5.1)
Sodium: 137 mEq/L (ref 135–145)
Total Bilirubin: 0.8 mg/dL (ref 0.2–1.2)
Total Protein: 7 g/dL (ref 6.0–8.3)

## 2019-07-13 LAB — LIPID PANEL
Cholesterol: 161 mg/dL (ref 0–200)
HDL: 57.8 mg/dL (ref >39.00)
LDL Cholesterol: 88 mg/dL (ref 0–99)
NonHDL: 102.75
Total CHOL/HDL Ratio: 3
Triglycerides: 76 mg/dL (ref 0.0–149.0)
VLDL: 15.2 mg/dL (ref 0.0–40.0)

## 2019-07-14 LAB — HEMOGLOBIN A1C: Hgb A1c MFr Bld: 5.4 % (ref 4.6–6.5)

## 2019-08-01 DIAGNOSIS — G4733 Obstructive sleep apnea (adult) (pediatric): Secondary | ICD-10-CM | POA: Diagnosis not present

## 2019-09-25 DIAGNOSIS — Z6826 Body mass index (BMI) 26.0-26.9, adult: Secondary | ICD-10-CM | POA: Diagnosis not present

## 2019-09-25 DIAGNOSIS — G8929 Other chronic pain: Secondary | ICD-10-CM | POA: Diagnosis not present

## 2019-09-25 DIAGNOSIS — E663 Overweight: Secondary | ICD-10-CM | POA: Diagnosis not present

## 2019-09-25 DIAGNOSIS — M1990 Unspecified osteoarthritis, unspecified site: Secondary | ICD-10-CM | POA: Diagnosis not present

## 2019-09-25 DIAGNOSIS — N529 Male erectile dysfunction, unspecified: Secondary | ICD-10-CM | POA: Diagnosis not present

## 2019-09-25 DIAGNOSIS — G4730 Sleep apnea, unspecified: Secondary | ICD-10-CM | POA: Diagnosis not present

## 2019-09-25 DIAGNOSIS — R32 Unspecified urinary incontinence: Secondary | ICD-10-CM | POA: Diagnosis not present

## 2019-09-25 DIAGNOSIS — Z85828 Personal history of other malignant neoplasm of skin: Secondary | ICD-10-CM | POA: Diagnosis not present

## 2019-09-25 DIAGNOSIS — R030 Elevated blood-pressure reading, without diagnosis of hypertension: Secondary | ICD-10-CM | POA: Diagnosis not present

## 2019-09-25 DIAGNOSIS — E785 Hyperlipidemia, unspecified: Secondary | ICD-10-CM | POA: Diagnosis not present

## 2019-10-17 ENCOUNTER — Encounter: Payer: Self-pay | Admitting: Family Medicine

## 2019-10-18 DIAGNOSIS — R69 Illness, unspecified: Secondary | ICD-10-CM | POA: Diagnosis not present

## 2019-11-03 DIAGNOSIS — Z20822 Contact with and (suspected) exposure to covid-19: Secondary | ICD-10-CM | POA: Diagnosis not present

## 2019-11-24 DIAGNOSIS — G4733 Obstructive sleep apnea (adult) (pediatric): Secondary | ICD-10-CM | POA: Diagnosis not present

## 2019-12-11 ENCOUNTER — Other Ambulatory Visit: Payer: Self-pay

## 2019-12-11 ENCOUNTER — Ambulatory Visit (INDEPENDENT_AMBULATORY_CARE_PROVIDER_SITE_OTHER): Payer: Medicare HMO | Admitting: Family Medicine

## 2019-12-11 ENCOUNTER — Encounter: Payer: Self-pay | Admitting: Family Medicine

## 2019-12-11 VITALS — BP 110/79 | HR 66 | Temp 98.0°F | Ht 70.0 in | Wt 182.8 lb

## 2019-12-11 DIAGNOSIS — H9193 Unspecified hearing loss, bilateral: Secondary | ICD-10-CM

## 2019-12-11 DIAGNOSIS — D2272 Melanocytic nevi of left lower limb, including hip: Secondary | ICD-10-CM | POA: Diagnosis not present

## 2019-12-11 DIAGNOSIS — E782 Mixed hyperlipidemia: Secondary | ICD-10-CM

## 2019-12-11 DIAGNOSIS — X32XXXA Exposure to sunlight, initial encounter: Secondary | ICD-10-CM | POA: Diagnosis not present

## 2019-12-11 DIAGNOSIS — D2261 Melanocytic nevi of right upper limb, including shoulder: Secondary | ICD-10-CM | POA: Diagnosis not present

## 2019-12-11 DIAGNOSIS — M65352 Trigger finger, left little finger: Secondary | ICD-10-CM

## 2019-12-11 DIAGNOSIS — D225 Melanocytic nevi of trunk: Secondary | ICD-10-CM | POA: Diagnosis not present

## 2019-12-11 DIAGNOSIS — L570 Actinic keratosis: Secondary | ICD-10-CM | POA: Diagnosis not present

## 2019-12-11 DIAGNOSIS — H9313 Tinnitus, bilateral: Secondary | ICD-10-CM

## 2019-12-11 DIAGNOSIS — D2262 Melanocytic nevi of left upper limb, including shoulder: Secondary | ICD-10-CM | POA: Diagnosis not present

## 2019-12-11 DIAGNOSIS — Z85828 Personal history of other malignant neoplasm of skin: Secondary | ICD-10-CM | POA: Diagnosis not present

## 2019-12-11 DIAGNOSIS — G4733 Obstructive sleep apnea (adult) (pediatric): Secondary | ICD-10-CM | POA: Diagnosis not present

## 2019-12-11 NOTE — Progress Notes (Signed)
[name], MD Phone: [phone]  Terry Cameron is a 74 y.o. male who presents today for f/u.  HYPERLIPIDEMIA Symptoms Chest pain on exertion:  no   Leg claudication:   no Medications: Compliance- taking liptior Right upper quadrant pain- no  Muscle aches- no  OSA: Using CPAP nightly all night.  Wakes up mostly well rested.  No hypersomnia.  Hearing decreased: Patient notes his wife is complaining that his hearing seems to be getting worse.  He does have bilateral mild tinnitus that is been chronic and ongoing.  Trigger finger: Notes in his left fifth finger.  He has had an in office procedure on this previously and does not have any terribly significant issues with this.  He does have a small cyst over his DIP joint in his left index finger as well dorsally.    Social History   Tobacco Use  Smoking Status Former Smoker  . Quit date: 65  . Years since quitting: 47.5  Smokeless Tobacco Never Used  Tobacco Comment   SMOKED FOR ABOUT 8 YEARS     ROS see history of present illness  Objective  Physical Exam Vitals:   12/11/19 0932  BP: 110/79  Pulse: 66  Temp: 98 F (36.7 C)  SpO2: 99%    BP Readings from Last 3 Encounters:  12/11/19 110/79  12/20/18 (!) 110/58  06/03/18 118/60   Wt Readings from Last 3 Encounters:  12/11/19 182 lb 12.8 oz (82.9 kg)  06/09/19 181 lb (82.1 kg)  12/20/18 182 lb (82.6 kg)    Physical Exam Constitutional:      General: He is not in acute distress.    Appearance: He is not diaphoretic.  HENT:     Right Ear: Tympanic membrane and ear canal normal.     Left Ear: Tympanic membrane and ear canal normal.     Ears:     Comments: Hearing intact to finger rub Cardiovascular:     Rate and Rhythm: Normal rate and regular rhythm.     Heart sounds: Normal heart sounds.  Pulmonary:     Effort: Pulmonary effort is normal.     Breath sounds: Normal breath sounds.  Musculoskeletal:     Right lower leg: No edema.     Left  lower leg: No edema.     Comments: Small half a centimeter sized apparent cyst overlying the dorsal aspect of his DIP joint of his left index finger, nontender, he does have apparent mild trigger finger in the left fifth finger as well with good range of motion and no catching today.  Skin:    General: Skin is warm and dry.  Neurological:     Mental Status: He is alert.      Assessment/Plan: Please see individual problem list.  OSA (obstructive sleep apnea) Symptomatically well controlled.  We will request a compliance report.  He will continue his CPAP.  Trigger little finger of left hand Previously treated by orthopedics.  He will monitor for any worsening and follow-up with them if needed.  He will also monitor the cyst over his left index finger.  If this worsens he will let us know.  Tinnitus Chronic ongoing issue.  Some mild hearing loss.  Refer to audiology.  Hyperlipidemia Adequately controlled.  Continue Lipitor.   Orders Placed This Encounter  Procedures  . Ambulatory referral to Audiology    Referral Priority:   Routine    Referral Type:   Audiology Exam    Referral Reason:  Specialty Services Required    Number of Visits Requested:   1    No orders of the defined types were placed in this encounter.   This visit occurred during the SARS-CoV-2 public health emergency.  Safety protocols were in place, including screening questions prior to the visit, additional usage of staff PPE, and extensive cleaning of exam room while observing appropriate contact time as indicated for disinfecting solutions.    [name], MD [place]

## 2019-12-11 NOTE — Assessment & Plan Note (Signed)
Previously treated by orthopedics.  He will monitor for any worsening and follow-up with them if needed.  He will also monitor the cyst over his left index finger.  If this worsens he will let us know.

## 2019-12-11 NOTE — Assessment & Plan Note (Signed)
Adequately controlled.  Continue Lipitor. 

## 2019-12-11 NOTE — Assessment & Plan Note (Signed)
Symptomatically well controlled.  We will request a compliance report.  He will continue his CPAP.

## 2019-12-11 NOTE — Assessment & Plan Note (Signed)
Chronic ongoing issue.  Some mild hearing loss.  Refer to audiology.

## 2019-12-11 NOTE — Patient Instructions (Signed)
Nice to see you. I have referred you to the audiologist. Please monitor your trigger finger and if this changes or worsens please see orthopedics.

## 2019-12-25 DIAGNOSIS — G4733 Obstructive sleep apnea (adult) (pediatric): Secondary | ICD-10-CM | POA: Diagnosis not present

## 2020-01-01 DIAGNOSIS — H903 Sensorineural hearing loss, bilateral: Secondary | ICD-10-CM | POA: Diagnosis not present

## 2020-01-10 DIAGNOSIS — M65332 Trigger finger, left middle finger: Secondary | ICD-10-CM | POA: Diagnosis not present

## 2020-01-25 DIAGNOSIS — G4733 Obstructive sleep apnea (adult) (pediatric): Secondary | ICD-10-CM | POA: Diagnosis not present

## 2020-02-02 ENCOUNTER — Ambulatory Visit: Payer: Medicare HMO

## 2020-02-04 ENCOUNTER — Other Ambulatory Visit: Payer: Self-pay | Admitting: Family Medicine

## 2020-02-19 DIAGNOSIS — R69 Illness, unspecified: Secondary | ICD-10-CM | POA: Diagnosis not present

## 2020-03-06 ENCOUNTER — Telehealth (INDEPENDENT_AMBULATORY_CARE_PROVIDER_SITE_OTHER): Payer: Self-pay | Admitting: Gastroenterology

## 2020-03-06 ENCOUNTER — Other Ambulatory Visit: Payer: Self-pay

## 2020-03-06 DIAGNOSIS — Z86010 Personal history of colonic polyps: Secondary | ICD-10-CM

## 2020-03-06 MED ORDER — NA SULFATE-K SULFATE-MG SULF 17.5-3.13-1.6 GM/177ML PO SOLN: 1.0000 | Freq: Once | ORAL | 0 refills | Status: AC

## 2020-03-06 NOTE — Progress Notes (Signed)
Gastroenterology Pre-Procedure Review  Request Date: Friday 04/26/20 Requesting Physician: Dr. Vicente Males  PATIENT REVIEW QUESTIONS: The patient responded to the following health history questions as indicated:    1. Are you having any GI issues? no 2. Do you have a personal history of Polyps? yes (02/22/17 colonoscopy report noted colon polyps performed by Dr. Allen Norris) 3. Do you have a family history of Colon Cancer or Polyps? no 4. Diabetes Mellitus? no 5. Joint replacements in the past 12 months?no 6. Major health problems in the past 3 months?no 7. Any artificial heart valves, MVP, or defibrillator?no    MEDICATIONS & ALLERGIES:    Patient reports the following regarding taking any anticoagulation/antiplatelet therapy:   Plavix, Coumadin, Eliquis, Xarelto, Lovenox, Pradaxa, Brilinta, or Effient? no Aspirin? no  Patient confirms/reports the following medications:  Current Outpatient Medications  Medication Sig Dispense Refill   atorvastatin (LIPITOR) 10 MG tablet TAKE 1 TABLET BY MOUTH EVERY DAY 90 tablet 3   meloxicam (MOBIC) 15 MG tablet Take 15 mg by mouth daily.     Na Sulfate-K Sulfate-Mg Sulf 17.5-3.13-1.6 GM/177ML SOLN Take 1 kit by mouth once for 1 dose. 354 mL 0   No current facility-administered medications for this visit.    Patient confirms/reports the following allergies:  No Known Allergies  No orders of the defined types were placed in this encounter.   AUTHORIZATION INFORMATION Primary Insurance: 1D#: Group #:  Secondary Insurance: 1D#: Group #:  SCHEDULE INFORMATION: Date: 04/26/20 Time: Location:[facility]

## 2020-03-11 ENCOUNTER — Other Ambulatory Visit: Payer: Medicare HMO

## 2020-03-11 DIAGNOSIS — Z20822 Contact with and (suspected) exposure to covid-19: Secondary | ICD-10-CM | POA: Diagnosis not present

## 2020-03-12 LAB — NOVEL CORONAVIRUS, NAA: SARS-CoV-2, NAA: NOT DETECTED

## 2020-03-12 LAB — SARS-COV-2, NAA 2 DAY TAT

## 2020-03-27 DIAGNOSIS — H524 Presbyopia: Secondary | ICD-10-CM | POA: Diagnosis not present

## 2020-04-09 ENCOUNTER — Telehealth: Payer: Self-pay | Admitting: Family Medicine

## 2020-04-09 ENCOUNTER — Telehealth: Payer: Self-pay

## 2020-04-09 ENCOUNTER — Ambulatory Visit: Payer: Medicare HMO

## 2020-04-09 NOTE — Telephone Encounter (Signed)
Patient needs to reschedule with Denisa missed appointment phone problems  He hung up before I could schedule him

## 2020-04-09 NOTE — Telephone Encounter (Signed)
Unable to connect with patient via virtual or telephone on preferred number for scheduled awv. Left message to call the office back in allotted time frame or reschedule.

## 2020-04-24 ENCOUNTER — Other Ambulatory Visit
Admission: RE | Admit: 2020-04-24 | Discharge: 2020-04-24 | Disposition: A | Discharge status: Home / Self Care | Payer: Medicare HMO | Source: Ambulatory Visit | Attending: Gastroenterology | Admitting: Gastroenterology

## 2020-04-24 ENCOUNTER — Other Ambulatory Visit: Payer: Self-pay

## 2020-04-24 DIAGNOSIS — Z01812 Encounter for preprocedural laboratory examination: Secondary | ICD-10-CM | POA: Diagnosis not present

## 2020-04-24 DIAGNOSIS — Z20822 Contact with and (suspected) exposure to covid-19: Secondary | ICD-10-CM | POA: Insufficient documentation

## 2020-04-24 LAB — SARS CORONAVIRUS 2 (TAT 6-24 HRS): SARS Coronavirus 2: NEGATIVE

## 2020-04-25 ENCOUNTER — Encounter: Payer: Self-pay | Admitting: Gastroenterology

## 2020-04-26 ENCOUNTER — Other Ambulatory Visit: Payer: Self-pay

## 2020-04-26 ENCOUNTER — Encounter
Admission: RE | Disposition: A | Discharge status: Home / Self Care | Payer: Self-pay | Source: Home / Self Care | Attending: Gastroenterology

## 2020-04-26 ENCOUNTER — Encounter: Payer: Self-pay | Admitting: Gastroenterology

## 2020-04-26 ENCOUNTER — Ambulatory Visit: Payer: Medicare HMO | Admitting: Certified Registered"

## 2020-04-26 ENCOUNTER — Ambulatory Visit
Admission: RE | Admit: 2020-04-26 | Discharge: 2020-04-26 | Disposition: A | Discharge status: Home / Self Care | Payer: Medicare HMO | Attending: Gastroenterology | Admitting: Gastroenterology

## 2020-04-26 DIAGNOSIS — K635 Polyp of colon: Secondary | ICD-10-CM

## 2020-04-26 DIAGNOSIS — Z86010 Personal history of colonic polyps: Secondary | ICD-10-CM

## 2020-04-26 DIAGNOSIS — D125 Benign neoplasm of sigmoid colon: Secondary | ICD-10-CM | POA: Diagnosis not present

## 2020-04-26 DIAGNOSIS — Z1211 Encounter for screening for malignant neoplasm of colon: Secondary | ICD-10-CM | POA: Diagnosis not present

## 2020-04-26 DIAGNOSIS — Z87891 Personal history of nicotine dependence: Secondary | ICD-10-CM | POA: Diagnosis not present

## 2020-04-26 DIAGNOSIS — Z79899 Other long term (current) drug therapy: Secondary | ICD-10-CM | POA: Insufficient documentation

## 2020-04-26 DIAGNOSIS — Z791 Long term (current) use of non-steroidal anti-inflammatories (NSAID): Secondary | ICD-10-CM | POA: Diagnosis not present

## 2020-04-26 DIAGNOSIS — K5790 Diverticulosis of intestine, part unspecified, without perforation or abscess without bleeding: Secondary | ICD-10-CM | POA: Diagnosis not present

## 2020-04-26 DIAGNOSIS — K5730 Diverticulosis of large intestine without perforation or abscess without bleeding: Secondary | ICD-10-CM | POA: Insufficient documentation

## 2020-04-26 DIAGNOSIS — D120 Benign neoplasm of cecum: Secondary | ICD-10-CM | POA: Diagnosis not present

## 2020-04-26 HISTORY — PX: COLONOSCOPY WITH PROPOFOL: SHX5780

## 2020-04-26 SURGERY — COLONOSCOPY WITH PROPOFOL
Anesthesia: General

## 2020-04-26 MED ORDER — PROPOFOL 500 MG/50ML IV EMUL
INTRAVENOUS | Status: AC
  Filled 2020-04-26: qty 100

## 2020-04-26 MED ORDER — LIDOCAINE HCL (PF) 2 % IJ SOLN
INTRAMUSCULAR | Status: AC
  Filled 2020-04-26: qty 5

## 2020-04-26 MED ORDER — PROPOFOL 500 MG/50ML IV EMUL
INTRAVENOUS | Status: DC | PRN
  Administered 2020-04-26: 120 ug/kg/min via INTRAVENOUS

## 2020-04-26 MED ORDER — LIDOCAINE 2% (20 MG/ML) 5 ML SYRINGE
INTRAMUSCULAR | Status: DC | PRN
  Administered 2020-04-26: 25 mg via INTRAVENOUS

## 2020-04-26 MED ORDER — SODIUM CHLORIDE 0.9 % IV SOLN: INTRAVENOUS | Status: DC

## 2020-04-26 MED ORDER — PROPOFOL 10 MG/ML IV BOLUS
INTRAVENOUS | Status: DC | PRN
  Administered 2020-04-26: 30 mg via INTRAVENOUS
  Administered 2020-04-26: 70 mg via INTRAVENOUS

## 2020-04-26 NOTE — Anesthesia Preprocedure Evaluation (Signed)
Anesthesia Evaluation  Patient identified by MRN, date of birth, ID band Patient awake    Reviewed: Allergy & Precautions, NPO status , Patient's Chart, lab work & pertinent test results, reviewed documented beta blocker date and time   History of Anesthesia Complications Negative for: history of anesthetic complications  Airway Mallampati: II  TM Distance: >3 FB     Dental  (+) Dental Advidsory Given, Teeth Intact   Pulmonary neg shortness of breath, sleep apnea and Continuous Positive Airway Pressure Ventilation , COPD, former smoker,    Pulmonary exam normal        Cardiovascular Exercise Tolerance: Good Normal cardiovascular exam     Neuro/Psych negative neurological ROS     GI/Hepatic negative GI ROS, Neg liver ROS,   Endo/Other  negative endocrine ROS  Renal/GU negative Renal ROS     Musculoskeletal  (+) Arthritis ,   Abdominal   Peds  Hematology   Anesthesia Other Findings Past Medical History: No date: Arthritis No date: Chicken pox No date: Emphysema of lung (HCC) No date: Hyperlipidemia No date: Sleep apnea   Reproductive/Obstetrics negative OB ROS                             Anesthesia Physical  Anesthesia Plan  ASA: II  Anesthesia Plan: General   Post-op Pain Management:    Induction: Intravenous  PONV Risk Score and Plan: 2 and Propofol infusion and TIVA  Airway Management Planned: Nasal Cannula and Natural Airway  Additional Equipment:   Intra-op Plan:   Post-operative Plan:   Informed Consent: I have reviewed the patients History and Physical, chart, labs and discussed the procedure including the risks, benefits and alternatives for the proposed anesthesia with the patient or authorized representative who has indicated his/her understanding and acceptance.       Plan Discussed with: CRNA  Anesthesia Plan Comments:         Anesthesia Quick  Evaluation

## 2020-04-26 NOTE — Transfer of Care (Signed)
Immediate Anesthesia Transfer of Care Note  Patient: Terry Cameron  Procedure(s) Performed: COLONOSCOPY WITH PROPOFOL (N/A )  Patient Location: Endoscopy Unit  Anesthesia Type:General  Level of Consciousness: awake  Airway & Oxygen Therapy: Patient Spontanous Breathing and Patient connected to nasal cannula oxygen  Post-op Assessment: Report given to RN and Post -op Vital signs reviewed and stable  Post vital signs: Reviewed  Last Vitals:  Vitals Value Taken Time  BP 97/59 04/26/20 0839  Temp 36.7 C 04/26/20 0839  Pulse 79 04/26/20 0839  Resp 16 04/26/20 0839  SpO2 98 % 04/26/20 0839  Vitals shown include unvalidated device data.  Last Pain:  Vitals:   04/26/20 0835  TempSrc: Temporal  PainSc: Asleep         Complications: No complications documented.

## 2020-04-26 NOTE — Anesthesia Postprocedure Evaluation (Signed)
Anesthesia Post Note  Patient: DAVIDJAMES BLANSETT  Procedure(s) Performed: COLONOSCOPY WITH PROPOFOL (N/A )  Patient location during evaluation: Endoscopy Anesthesia Type: General Level of consciousness: awake and alert Pain management: pain level controlled Vital Signs Assessment: post-procedure vital signs reviewed and stable Respiratory status: spontaneous breathing, nonlabored ventilation, respiratory function stable and patient connected to nasal cannula oxygen Cardiovascular status: blood pressure returned to baseline and stable Postop Assessment: no apparent nausea or vomiting Anesthetic complications: no   No complications documented.   Last Vitals:  Vitals:   04/26/20 0855 04/26/20 0905  BP: (!) 130/98 (!) 174/95  Pulse: 74 67  Resp: 13 20  Temp:    SpO2: 100% 100%    Last Pain:  Vitals:   04/26/20 0905  TempSrc:   PainSc: 0-No pain                 Martha Clan

## 2020-04-26 NOTE — H&P (Signed)
[name], MD [address], [place], [place], [place], [address] [address], [place], [place], [place], [address] Phone: [phone]  Fax: [phone]  Primary Care Physician:  Leone Haven, MD   Pre-Procedure History & Physical: HPI:  Terry Cameron is a 74 y.o. male is here for an colonoscopy.   Past Medical History:  Diagnosis Date  . Arthritis   . Chicken pox   . Emphysema of lung ([place])   . Hyperlipidemia   . Sleep apnea     Past Surgical History:  Procedure Laterality Date  . BASAL CELL CARCINOMA EXCISION    . COLONOSCOPY WITH PROPOFOL N/A 02/22/2017   Procedure: COLONOSCOPY WITH PROPOFOL;  Surgeon: [name], MD;  Location: [facility] ENDOSCOPY;  Service: Gastroenterology;  Laterality: N/A;    Prior to Admission medications   Medication Sig Start Date End Date Taking? Authorizing Provider  atorvastatin (LIPITOR) 10 MG tablet TAKE 1 TABLET BY MOUTH EVERY DAY 02/05/20   Leone Haven, MD  meloxicam (MOBIC) 15 MG tablet Take 15 mg by mouth daily. 02/07/20   [provider]    Allergies as of 03/06/2020  . (No Known Allergies)    Family History  Problem Relation Age of Onset  . Hypertension Father   . Heart attack Father   . Diabetes Maternal Grandmother   . Glaucoma Maternal Grandmother   . Arthritis Mother     Social History   Socioeconomic History  . Marital status: Married    Spouse name: Not on file  . Number of children: Not on file  . Years of education: Not on file  . Highest education level: Not on file  Occupational History  . Not on file  Tobacco Use  . Smoking status: Former Smoker    Quit date: 1974    Years since quitting: 47.9  . Smokeless tobacco: Never Used  . Tobacco comment: SMOKED FOR ABOUT 8 YEARS  Vaping Use  . Vaping Use: Never used  Substance and Sexual Activity  . Alcohol use: Yes    Comment: COUPLE TIMES A WEEK   . Drug use: No  . Sexual activity: Not on file  Other Topics Concern  . Not on  file  Social History Narrative  . Not on file   Social Determinants of Health   Financial Resource Strain: Not on file  Food Insecurity: Not on file  Transportation Needs: Not on file  Physical Activity: Not on file  Stress: Not on file  Social Connections: Not on file  Intimate Partner Violence: Not on file    Review of Systems: See HPI, otherwise negative ROS  Physical Exam: BP 96/64   Pulse 95   Temp (!) 97 F (36.1 C) (Temporal)   Resp 18   Ht 5\' 10"  (1.778 m)   Wt 81.6 kg   SpO2 100%   BMI 25.83 kg/m  General:   Alert,  pleasant and cooperative in NAD Head:  Normocephalic and atraumatic. Neck:  Supple; no masses or thyromegaly. Lungs:  Clear throughout to auscultation, normal respiratory effort.    Heart:  +S1, +S2, Regular rate and rhythm, No edema. Abdomen:  Soft, nontender and nondistended. Normal bowel sounds, without guarding, and without rebound.   Neurologic:  Alert and  oriented x4;  grossly normal neurologically.  Impression/Plan: Terry Cameron is here for an colonoscopy to be performed for surveillance due to prior history of colon polyps   Risks, benefits, limitations, and alternatives regarding  colonoscopy have  been reviewed with the patient.  Questions have been answered.  All parties agreeable.   [name], MD  04/26/2020, 8:07 AM

## 2020-04-26 NOTE — Op Note (Signed)
[facility] Gastroenterology Patient Name: Terry Cameron Procedure Date: 04/26/2020 8:10 AM MRN: [id number] Account #: [id number] Date of Birth: 06/15/1945 Admit Type: Outpatient Age: 74 Room: [facility] ENDO ROOM 4 Gender: Male Note Status: Finalized Procedure:             Colonoscopy Indications:           High risk colon cancer surveillance: Personal history                         of colonic polyps, Surveillance: Personal history of                         colonic polyps (unknown histology) on last colonoscopy                         3 years ago, Last colonoscopy: October 2018 Providers:             [name] MD, MD Medicines:             Monitored Anesthesia Care Complications:         No immediate complications. Procedure:             Pre-Anesthesia Assessment:                        - Prior to the procedure, a History and Physical was                         performed, and patient medications, allergies and                         sensitivities were reviewed. The patient's tolerance                         of previous anesthesia was reviewed.                        - The risks and benefits of the procedure and the                         sedation options and risks were discussed with the                         patient. All questions were answered and informed                         consent was obtained.                        - ASA Grade Assessment: II - A patient with mild                         systemic disease.                        After obtaining informed consent, the colonoscope was                         passed under direct vision. Throughout the procedure,  the patient's blood pressure, pulse, and oxygen                         saturations were monitored continuously. The                         Colonoscope was introduced through the anus and                         advanced to the the cecum, identified by the                          appendiceal orifice. The colonoscopy was performed                         with ease. The patient tolerated the procedure well.                         The quality of the bowel preparation was excellent. Findings:      The perianal and digital rectal examinations were normal.      Four sessile polyps were found in the cecum. The polyps were 4 to 6 mm       in size. These polyps were removed with a cold snare. Resection and       retrieval were complete.      A 4 mm polyp was found in the sigmoid colon. The polyp was sessile. The       polyp was removed with a cold snare. Resection and retrieval were       complete.      The exam was otherwise without abnormality on direct and retroflexion       views.      Multiple small-mouthed diverticula were found in the right colon.      The exam was otherwise without abnormality on direct and retroflexion       views. Impression:            - Four 4 to 6 mm polyps in the cecum, removed with a                         cold snare. Resected and retrieved.                        - One 4 mm polyp in the sigmoid colon, removed with a                         cold snare. Resected and retrieved.                        - The examination was otherwise normal on direct and                         retroflexion views.                        - Diverticulosis in the right colon.                        - The examination was otherwise normal on direct and  retroflexion views. Recommendation:        - Discharge patient to home (with escort).                        - Resume previous diet.                        - Continue present medications.                        - Await pathology results.                        - Repeat colonoscopy for surveillance based on                         pathology results. Procedure Code(s):     --- Professional ---                        [phone], Colonoscopy, flexible; with removal of                          tumor(s), polyp(s), or other lesion(s) by snare                         technique Diagnosis Code(s):     --- Professional ---                        K63.5, Polyp of colon                        Z86.010, Personal history of colonic polyps                        K57.30, Diverticulosis of large intestine without                         perforation or abscess without bleeding CPT copyright 2019 American Medical Association. All rights reserved. The codes documented in this report are preliminary and upon coder review may  be revised to meet current compliance requirements. [name], MD [name] MD, MD 04/26/2020 8:33:57 AM This report has been signed electronically. Number of Addenda: 0 Note Initiated On: 04/26/2020 8:10 AM Scope Withdrawal Time: 0 hours 15 minutes 49 seconds  Total Procedure Duration: 0 hours 19 minutes 6 seconds  Estimated Blood Loss:  Estimated blood loss: none.      [facility]

## 2020-04-29 ENCOUNTER — Encounter: Payer: Self-pay | Admitting: Gastroenterology

## 2020-04-29 LAB — SURGICAL PATHOLOGY

## 2020-05-02 ENCOUNTER — Encounter: Payer: Self-pay | Admitting: Gastroenterology

## 2020-05-03 DIAGNOSIS — G4733 Obstructive sleep apnea (adult) (pediatric): Secondary | ICD-10-CM | POA: Diagnosis not present

## 2020-06-03 DIAGNOSIS — G4733 Obstructive sleep apnea (adult) (pediatric): Secondary | ICD-10-CM | POA: Diagnosis not present

## 2020-06-12 ENCOUNTER — Ambulatory Visit: Payer: Medicare HMO | Admitting: Family Medicine

## 2020-06-14 DIAGNOSIS — D2262 Melanocytic nevi of left upper limb, including shoulder: Secondary | ICD-10-CM | POA: Diagnosis not present

## 2020-06-14 DIAGNOSIS — D2261 Melanocytic nevi of right upper limb, including shoulder: Secondary | ICD-10-CM | POA: Diagnosis not present

## 2020-06-14 DIAGNOSIS — X32XXXA Exposure to sunlight, initial encounter: Secondary | ICD-10-CM | POA: Diagnosis not present

## 2020-06-14 DIAGNOSIS — Z85828 Personal history of other malignant neoplasm of skin: Secondary | ICD-10-CM | POA: Diagnosis not present

## 2020-06-14 DIAGNOSIS — D225 Melanocytic nevi of trunk: Secondary | ICD-10-CM | POA: Diagnosis not present

## 2020-06-14 DIAGNOSIS — L570 Actinic keratosis: Secondary | ICD-10-CM | POA: Diagnosis not present

## 2020-06-14 DIAGNOSIS — D2272 Melanocytic nevi of left lower limb, including hip: Secondary | ICD-10-CM | POA: Diagnosis not present

## 2020-06-18 ENCOUNTER — Telehealth: Payer: Self-pay | Admitting: Family Medicine

## 2020-06-18 NOTE — Telephone Encounter (Signed)
Left message for patient to call back and schedule Medicare Annual Wellness Visit (AWV)   This should be a virtual visit only=30 minutes.  Last AWV 02/01/19; please schedule at anytime with Denisa O'Brien-Blaney at [facility].

## 2020-06-21 ENCOUNTER — Ambulatory Visit: Payer: Medicare HMO | Admitting: Family Medicine

## 2020-06-27 DIAGNOSIS — H3562 Retinal hemorrhage, left eye: Secondary | ICD-10-CM | POA: Diagnosis not present

## 2020-07-04 DIAGNOSIS — G4733 Obstructive sleep apnea (adult) (pediatric): Secondary | ICD-10-CM | POA: Diagnosis not present

## 2020-07-31 ENCOUNTER — Ambulatory Visit: Payer: Medicare HMO | Admitting: Family Medicine

## 2020-08-01 DIAGNOSIS — G4733 Obstructive sleep apnea (adult) (pediatric): Secondary | ICD-10-CM | POA: Diagnosis not present

## 2020-08-27 ENCOUNTER — Other Ambulatory Visit: Payer: Self-pay

## 2020-08-27 ENCOUNTER — Ambulatory Visit (INDEPENDENT_AMBULATORY_CARE_PROVIDER_SITE_OTHER): Payer: Medicare HMO | Admitting: Family Medicine

## 2020-08-27 ENCOUNTER — Encounter: Payer: Self-pay | Admitting: Family Medicine

## 2020-08-27 DIAGNOSIS — M1990 Unspecified osteoarthritis, unspecified site: Secondary | ICD-10-CM | POA: Diagnosis not present

## 2020-08-27 DIAGNOSIS — G4733 Obstructive sleep apnea (adult) (pediatric): Secondary | ICD-10-CM | POA: Diagnosis not present

## 2020-08-27 DIAGNOSIS — E782 Mixed hyperlipidemia: Secondary | ICD-10-CM

## 2020-08-27 DIAGNOSIS — R7303 Prediabetes: Secondary | ICD-10-CM

## 2020-08-27 DIAGNOSIS — S60519A Abrasion of unspecified hand, initial encounter: Secondary | ICD-10-CM | POA: Diagnosis not present

## 2020-08-27 LAB — COMPREHENSIVE METABOLIC PANEL
ALT: 26 U/L (ref 0–53)
AST: 28 U/L (ref 0–37)
Albumin: 4.2 g/dL (ref 3.5–5.2)
Alkaline Phosphatase: 74 U/L (ref 39–117)
BUN: 33 mg/dL — ABNORMAL HIGH (ref 6–23)
CO2: 27 mEq/L (ref 19–32)
Calcium: 9.8 mg/dL (ref 8.4–10.5)
Chloride: 104 mEq/L (ref 96–112)
Creatinine, Ser: 1.17 mg/dL (ref 0.40–1.50)
GFR: 61.95 mL/min (ref >60.00)
Glucose, Bld: 100 mg/dL — ABNORMAL HIGH (ref 70–99)
Potassium: 5 mEq/L (ref 3.5–5.1)
Sodium: 140 mEq/L (ref 135–145)
Total Bilirubin: 0.5 mg/dL (ref 0.2–1.2)
Total Protein: 7.1 g/dL (ref 6.0–8.3)

## 2020-08-27 LAB — LIPID PANEL
Cholesterol: 117 mg/dL (ref 0–200)
HDL: 52.3 mg/dL (ref >39.00)
LDL Cholesterol: 50 mg/dL (ref 0–99)
NonHDL: 64.65
Total CHOL/HDL Ratio: 2
Triglycerides: 72 mg/dL (ref 0.0–149.0)
VLDL: 14.4 mg/dL (ref 0.0–40.0)

## 2020-08-27 LAB — HEMOGLOBIN A1C: Hgb A1c MFr Bld: 5.5 % (ref 4.6–6.5)

## 2020-08-27 NOTE — Assessment & Plan Note (Signed)
Related to a fall.  No significant findings on exam.  Discussed not using raised grips for his push-ups and seeing if that helps this improve more quickly.

## 2020-08-27 NOTE — Progress Notes (Signed)
[name], MD Phone: [phone]  Terry Cameron is a 75 y.o. male who presents today for f/u.  OSA CPAP use: nightly for 8 hours Hypersomnia: no Well rested: yes CPAP company: [employer]  HYPERLIPIDEMIA Symptoms Chest pain on exertion:  no   Leg claudication:   no Medications: Compliance- taking lipitor Right upper quadrant pain- no  Muscle aches- no  Knee/hip pain: Patient notes occasional knee or hip pain only in the morning when he wakes up.  Once he gets moving this resolves.  Prediabetes: Patient notes he is eating fruit yogurt in the morning.  He has beef a couple times a week.  Typically has a wrap for lunch with less bread.  No soda or sweet tea.  Occasional beer or wine.  Walks 2-3 times a week playing golf.  Also plays pickle ball 3 times a week.  Patient notes he took a fall while playing pickle ball about a month or so ago.  He notes a little soreness on his palms when he does push-ups since then.  He uses raised grips to do his push-ups.   Social History   Tobacco Use  Smoking Status Former Smoker  . Quit date: 63  . Years since quitting: 48.3  Smokeless Tobacco Never Used  Tobacco Comment   SMOKED FOR ABOUT 8 YEARS    Current Outpatient Medications on File Prior to Visit  Medication Sig Dispense Refill  . atorvastatin (LIPITOR) 10 MG tablet TAKE 1 TABLET BY MOUTH EVERY DAY 90 tablet 3  . meloxicam (MOBIC) 15 MG tablet Take 15 mg by mouth daily.    [place] [employer]unning BOWEL PREP KIT 17.5-3.13-1.6 GM/177ML SOLN Take by mouth.     No current facility-administered medications on file prior to visit.     ROS see history of present illness  Objective  Physical Exam Vitals:   08/27/20 1210  BP: 120/75  Pulse: 68  Temp: 98.3 F (36.8 C)  SpO2: 99%    BP Readings from Last 3 Encounters:  08/27/20 120/75  04/26/20 (!) 174/95  12/11/19 110/79   Wt Readings from Last 3 Encounters:  08/27/20 186 lb 3.2 oz (84.5 kg)  04/26/20 180 lb (81.6 kg)   12/11/19 182 lb 12.8 oz (82.9 kg)    Physical Exam Constitutional:      General: He is not in acute distress.    Appearance: He is not diaphoretic.  Cardiovascular:     Rate and Rhythm: Normal rate and regular rhythm.     Heart sounds: Normal heart sounds.  Pulmonary:     Effort: Pulmonary effort is normal.     Breath sounds: Normal breath sounds.  Musculoskeletal:       Hands:     Right lower leg: No edema.     Left lower leg: No edema.  Skin:    General: Skin is warm and dry.  Neurological:     Mental Status: He is alert.      Assessment/Plan: Please see individual problem list.  Problem List Items Addressed This Visit    Hyperlipidemia    Check lab work.  Continue Lipitor 10 mg once daily.      Relevant Orders   Comp Met (CMET)   Lipid panel   OSA (obstructive sleep apnea)    Symptomatically well controlled.  We will request a compliance report.      Prediabetes    Check A1c.  Continue healthy diet and exercise.      Relevant Orders  HgB A1c   Abrasion of palm    Related to a fall.  No significant findings on exam.  Discussed not using raised grips for his push-ups and seeing if that helps this improve more quickly.      Osteoarthritis    Arthritis is likely contributing to his knee and hip discomfort that only occurs right when he wakes up in the morning.  He will monitor.        This visit occurred during the SARS-CoV-2 public health emergency.  Safety protocols were in place, including screening questions prior to the visit, additional usage of staff PPE, and extensive cleaning of exam room while observing appropriate contact time as indicated for disinfecting solutions.    Eric Sonnenberg, MD [facility] [place]  

## 2020-08-27 NOTE — Assessment & Plan Note (Signed)
Symptomatically well controlled.  We will request a compliance report.

## 2020-08-27 NOTE — Assessment & Plan Note (Signed)
Check A1c.  Continue healthy diet and exercise.

## 2020-08-27 NOTE — Patient Instructions (Signed)
Nice to see you. We will get labs today. Please let me know if your palms do not start to improve.

## 2020-08-27 NOTE — Assessment & Plan Note (Signed)
Arthritis is likely contributing to his knee and hip discomfort that only occurs right when he wakes up in the morning.  He will monitor.

## 2020-08-27 NOTE — Assessment & Plan Note (Signed)
Check lab work.  Continue Lipitor 10 mg once daily.

## 2020-09-04 NOTE — Progress Notes (Signed)
I called to [employer] compliance dept. And [facility]  To get a compliance report on the CPAP machine serial # [id number] for this patient.  Sheneka Schrom,cma

## 2020-09-05 NOTE — Telephone Encounter (Signed)
-----   Message from Leone Haven, MD sent at 08/27/2020 12:24 PM EDT ----- Please contact [employer] to request a compliance report for his CPAP.  Thanks.

## 2020-09-05 NOTE — Telephone Encounter (Signed)
Compliance report received by fax today per the provider and placed in lab basket for review.  Caralyn Twining,cma

## 2020-09-06 NOTE — Telephone Encounter (Signed)
Compliance report reviewed.  OSA is well controlled.

## 2020-09-24 ENCOUNTER — Telehealth: Payer: Self-pay | Admitting: Family Medicine

## 2020-09-24 NOTE — Telephone Encounter (Signed)
Left message for patient to call back and schedule Medicare Annual Wellness Visit (AWV) in office.   If not able to come in office, please offer to do virtually or by telephone.   Last AWV:02/01/2019   Please schedule at anytime with Nurse Health Advisor.   

## 2020-10-17 ENCOUNTER — Telehealth: Payer: Self-pay | Admitting: Family Medicine

## 2020-10-17 NOTE — Telephone Encounter (Signed)
Left message for patient to call back and schedule Medicare Annual Wellness Visit (AWV) in office.   If not able to come in office, please offer to do virtually or by telephone.   Last AWV:02/01/2019   Please schedule at anytime with Nurse Health Advisor.

## 2020-10-22 ENCOUNTER — Other Ambulatory Visit: Payer: Self-pay

## 2020-10-22 ENCOUNTER — Ambulatory Visit (INDEPENDENT_AMBULATORY_CARE_PROVIDER_SITE_OTHER): Payer: Medicare HMO

## 2020-10-22 VITALS — BP 120/75 | HR 68 | Temp 98.3°F | Ht 70.0 in | Wt 186.8 lb

## 2020-10-22 DIAGNOSIS — Z0000 Encounter for general adult medical examination without abnormal findings: Secondary | ICD-10-CM | POA: Diagnosis not present

## 2020-10-22 NOTE — Patient Instructions (Addendum)
[name] , Thank you for taking time to come for your Medicare Wellness Visit. I appreciate your ongoing commitment to your health goals. Please review the following plan we discussed and let me know if I can assist you in the future.   These are the goals we discussed: Goals      Patient Stated   .  Maintain Healthy Lifestyle (pt-stated)      Stay active Healthy diet       This is a list of the screening recommended for you and due dates:  Health Maintenance  Topic Date Due  . Zoster (Shingles) Vaccine (1 of 2) 01/22/2021*  . Flu Shot  12/16/2020  . Colon Cancer Screening  04/27/2023  . Tetanus Vaccine  01/02/2027  . COVID-19 Vaccine  Completed  . Hepatitis C Screening: USPSTF Recommendation to screen - Ages 76-79 yo.  Completed  . Pneumonia vaccines  Completed  . Pneumococcal Vaccination  Aged Out  . HPV Vaccine  Aged Out  *Topic was postponed. The date shown is not the original due date.     Immunizations Immunization History  Administered Date(s) Administered  . Influenza, High Dose Seasonal PF 03/05/2017, 02/21/2018  . Influenza,inj,Quad PF,6+ Mos 01/29/2019  . Influenza-Unspecified 02/19/2020  . PFIZER(Purple Top)SARS-COV-2 Vaccination 06/30/2019, 07/21/2019, 09/07/2020  . Pneumococcal Conjugate-13 01/01/2017  . Pneumococcal Polysaccharide-23 02/23/2018  . Tdap 01/01/2017   Advanced directives: End of life planning; Advance aging; Advanced directives discussed.  Copy of current HCPOA/Living Will requested.    Conditions/risks identified: none new  Follow up in one year for your annual wellness visit.   Preventive Care 56 Years and Older, Male Preventive care refers to lifestyle choices and visits with your health care provider that can promote health and wellness. What does preventive care include?  A yearly physical exam. This is also called an annual well check.  Dental exams once or twice a year.  Routine eye exams. Ask your health care provider how  often you should have your eyes checked.  Personal lifestyle choices, including:  Daily care of your teeth and gums.  Regular physical activity.  Eating a healthy diet.  Avoiding tobacco and drug use.  Limiting alcohol use.  Practicing safe sex.  Taking low doses of aspirin every day.  Taking vitamin and mineral supplements as recommended by your health care provider. What happens during an annual well check? The services and screenings done by your health care provider during your annual well check will depend on your age, overall health, lifestyle risk factors, and family history of disease. Counseling  Your health care provider may ask you questions about your:  Alcohol use.  Tobacco use.  Drug use.  Emotional well-being.  Home and relationship well-being.  Sexual activity.  Eating habits.  History of falls.  Memory and ability to understand (cognition).  Work and work Statistician. Screening  You may have the following tests or measurements:  Height, weight, and BMI.  Blood pressure.  Lipid and cholesterol levels. These may be checked every 5 years, or more frequently if you are over 45 years old.  Skin check.  Lung cancer screening. You may have this screening every year starting at age 49 if you have a 30-pack-year history of smoking and currently smoke or have quit within the past 15 years.  Fecal occult blood test (FOBT) of the stool. You may have this test every year starting at age 78.  Flexible sigmoidoscopy or colonoscopy. You may have a sigmoidoscopy every 5  years or a colonoscopy every 10 years starting at age 57.  Prostate cancer screening. Recommendations will vary depending on your family history and other risks.  Hepatitis C blood test.  Hepatitis B blood test.  Sexually transmitted disease (STD) testing.  Diabetes screening. This is done by checking your blood sugar (glucose) after you have not eaten for a while (fasting). You may  have this done every 1-3 years.  Abdominal aortic aneurysm (AAA) screening. You may need this if you are a current or former smoker.  Osteoporosis. You may be screened starting at age 68 if you are at high risk. Talk with your health care provider about your test results, treatment options, and if necessary, the need for more tests. Vaccines  Your health care provider may recommend certain vaccines, such as:  Influenza vaccine. This is recommended every year.  Tetanus, diphtheria, and acellular pertussis (Tdap, Td) vaccine. You may need a Td booster every 10 years.  Zoster vaccine. You may need this after age 21.  Pneumococcal 13-valent conjugate (PCV13) vaccine. One dose is recommended after age 65.  Pneumococcal polysaccharide (PPSV23) vaccine. One dose is recommended after age 14. Talk to your health care provider about which screenings and vaccines you need and how often you need them. This information is not intended to replace advice given to you by your health care provider. Make sure you discuss any questions you have with your health care provider. Document Released: 05/31/2015 Document Revised: 01/22/2016 Document Reviewed: 03/05/2015 Elsevier Interactive Patient Education  2017 [place] Prevention in the Home Falls can cause injuries. They can happen to people of all ages. There are many things you can do to make your home safe and to help prevent falls. What can I do on the outside of my home?  Regularly fix the edges of walkways and driveways and fix any cracks.  Remove anything that might make you trip as you walk through a door, such as a raised step or threshold.  Trim any bushes or trees on the path to your home.  Use bright outdoor lighting.  Clear any walking paths of anything that might make someone trip, such as rocks or tools.  Regularly check to see if handrails are loose or broken. Make sure that both sides of any steps have handrails.  Any  raised decks and porches should have guardrails on the edges.  Have any leaves, snow, or ice cleared regularly.  Use sand or salt on walking paths during winter.  Clean up any spills in your garage right away. This includes oil or grease spills. What can I do in the bathroom?  Use night lights.  Install grab bars by the toilet and in the tub and shower. Do not use towel bars as grab bars.  Use non-skid mats or decals in the tub or shower.  If you need to sit down in the shower, use a plastic, non-slip stool.  Keep the floor dry. Clean up any water that spills on the floor as soon as it happens.  Remove soap buildup in the tub or shower regularly.  Attach bath mats securely with double-sided non-slip rug tape.  Do not have throw rugs and other things on the floor that can make you trip. What can I do in the bedroom?  Use night lights.  Make sure that you have a light by your bed that is easy to reach.  Do not use any sheets or blankets that are too big for  your bed. They should not hang down onto the floor.  Have a firm chair that has side arms. You can use this for support while you get dressed.  Do not have throw rugs and other things on the floor that can make you trip. What can I do in the kitchen?  Clean up any spills right away.  Avoid walking on wet floors.  Keep items that you use a lot in easy-to-reach places.  If you need to reach something above you, use a strong step stool that has a grab bar.  Keep electrical cords out of the way.  Do not use floor polish or wax that makes floors slippery. If you must use wax, use non-skid floor wax.  Do not have throw rugs and other things on the floor that can make you trip. What can I do with my stairs?  Do not leave any items on the stairs.  Make sure that there are handrails on both sides of the stairs and use them. Fix handrails that are broken or loose. Make sure that handrails are as long as the  stairways.  Check any carpeting to make sure that it is firmly attached to the stairs. Fix any carpet that is loose or worn.  Avoid having throw rugs at the top or bottom of the stairs. If you do have throw rugs, attach them to the floor with carpet tape.  Make sure that you have a light switch at the top of the stairs and the bottom of the stairs. If you do not have them, ask someone to add them for you. What else can I do to help prevent falls?  Wear shoes that:  Do not have high heels.  Have rubber bottoms.  Are comfortable and fit you well.  Are closed at the toe. Do not wear sandals.  If you use a stepladder:  Make sure that it is fully opened. Do not climb a closed stepladder.  Make sure that both sides of the stepladder are locked into place.  Ask someone to hold it for you, if possible.  Clearly mark and make sure that you can see:  Any grab bars or handrails.  First and last steps.  Where the edge of each step is.  Use tools that help you move around (mobility aids) if they are needed. These include:  Canes.  Walkers.  Scooters.  Crutches.  Turn on the lights when you go into a dark area. Replace any light bulbs as soon as they burn out.  Set up your furniture so you have a clear path. Avoid moving your furniture around.  If any of your floors are uneven, fix them.  If there are any pets around you, be aware of where they are.  Review your medicines with your doctor. Some medicines can make you feel dizzy. This can increase your chance of falling. Ask your doctor what other things that you can do to help prevent falls. This information is not intended to replace advice given to you by your health care provider. Make sure you discuss any questions you have with your health care provider. Document Released: 02/28/2009 Document Revised: 10/10/2015 Document Reviewed: 06/08/2014 Elsevier Interactive Patient Education  2017 [employer].

## 2020-10-22 NOTE — Progress Notes (Addendum)
Subjective:   Terry Cameron is a 75 y.o. male who presents for Medicare Annual (Subsequent) preventive examination.  Review of Systems    No ROS.  Medicare Wellness   Cardiac Risk Factors include: advanced age (>89mn, >>19women);male gender     Objective:    Today's Vitals   10/22/20 1447  BP: 120/75  Pulse: 68  Temp: 98.3 F (36.8 C)  SpO2: 99%  Weight: 186 lb 12.8 oz (84.7 kg)  Height: '5\' 10"'  (1.778 m)   Body mass index is 26.8 kg/m.  Advanced Directives 10/22/2020 04/26/2020 02/01/2019  Does Patient Have a Medical Advance Directive? Yes Yes Yes  Type of Advance Directive HScofieldwill  Does patient want to make changes to medical advance directive? No - Patient declined - No - Patient declined  Copy of HAntelopein Chart? Yes - validated most recent copy scanned in chart (See row information) - -    Current Medications (verified) Outpatient Encounter Medications as of 10/22/2020  Medication Sig  . atorvastatin (LIPITOR) 10 MG tablet TAKE 1 TABLET BY MOUTH EVERY DAY  . meloxicam (MOBIC) 15 MG tablet Take 15 mg by mouth daily.  .Manus GunningBOWEL PREP KIT 17.5-3.13-1.6 GM/177ML SOLN Take by mouth.   No facility-administered encounter medications on file as of 10/22/2020.    Allergies (verified) Patient has no known allergies.   History: Past Medical History:  Diagnosis Date  . Arthritis   . Chicken pox   . Emphysema of lung ([place]ail   . Hyperlipidemia   . Sleep apnea    Past Surgical History:  Procedure Laterality Date  . BASAL CELL CARCINOMA EXCISION    . COLONOSCOPY WITH PROPOFOL N/A 02/22/2017   Procedure: COLONOSCOPY WITH PROPOFOL;  Surgeon: AJonathon Bellows MD;  [facility]  Service: Gastroenterology;  Laterality: N/A;  . COLONOSCOPY WITH PROPOFOL N/A 04/26/2020   Procedure: COLONOSCOPY WITH PROPOFOL;  Surgeon: AJonathon Bellows MD;  [facility]  Service: Gastroenterology;  Laterality: N/A;    Family History  Problem Relation Age of Onset  . Hypertension Father   . Heart attack Father   . Diabetes Maternal Grandmother   . Glaucoma Maternal Grandmother   . Arthritis Mother    Social History   Socioeconomic History  . Marital status: Married    Spouse name: Not on file  . Number of children: Not on file  . Years of education: Not on file  . Highest education level: Not on file  Occupational History  . Not on file  Tobacco Use  . Smoking status: Former Smoker    Quit date: 1974    Years since quitting: 48.4  . Smokeless tobacco: Never Used  . Tobacco comment: SMOKED FOR ABOUT 8 YEARS  Vaping Use  . Vaping Use: Never used  Substance and Sexual Activity  . Alcohol use: Yes    Comment: COUPLE TIMES A WEEK   . Drug use: No  . Sexual activity: Not on file  Other Topics Concern  . Not on file  Social History Narrative  . Not on file   Social Determinants of Health   Financial Resource Strain: Low Risk   . Difficulty of Paying Living Expenses: Not hard at all  Food Insecurity: No Food Insecurity  . Worried About RCharity fundraiserin the Last Year: Never true  . Ran Out of Food in the Last Year: Never true  Transportation Needs: No Transportation Needs  .  Lack of Transportation (Medical): No  . Lack of Transportation (Non-Medical): No  Physical Activity: Insufficiently Active  . Days of Exercise per Week: 4 days  . Minutes of Exercise per Session: 30 min  Stress: No Stress Concern Present  . Feeling of Stress : Not at all  Social Connections: Unknown  . Frequency of Communication with Friends and Family: Not on file  . Frequency of Social Gatherings with Friends and Family: Not on file  . Attends Religious Services: Not on file  . Active Member of Clubs or Organizations: Not on file  . Attends Archivist Meetings: Not on file  . Marital Status: Married    Tobacco Counseling Counseling given: Not Answered Comment: SMOKED FOR ABOUT 8  YEARS   Clinical Intake:  Pre-visit preparation completed: Yes        Diabetes: No  How often do you need to have someone help you when you read instructions, pamphlets, or other written materials from your doctor or pharmacy?: 1 - Never  Interpreter Needed?: No      Activities of Daily Living In your present state of health, do you have any difficulty performing the following activities: 10/22/2020  Hearing? Y  Vision? N  Difficulty concentrating or making decisions? N  Walking or climbing stairs? N  Dressing or bathing? N  Doing errands, shopping? N  Preparing Food and eating ? N  Using the Toilet? N  In the past six months, have you accidently leaked urine? N  Do you have problems with loss of bowel control? N  Managing your Medications? N  Managing your Finances? N  Housekeeping or managing your Housekeeping? N  Some recent data might be hidden    Patient Care Team: Leone Haven, MD as PCP - General (Family Medicine)  Indicate any recent Medical Services you may have received from other than [facility] providers in the past year (date may be approximate).     Assessment:   This is a routine wellness examination for [place].  Hearing/Vision screen  Hearing Screening   '125Hz'  '250Hz'  '500Hz'  '1000Hz'  '2000Hz'  '3000Hz'  '4000Hz'  '6000Hz'  '8000Hz'   Right ear:           Left ear:           Comments: Hearing aids  Vision Screening Comments: Wears corrective lenses  They have seen their ophthalmologist in the last 12 months.   Dietary issues and exercise activities discussed: Current Exercise Habits: Home exercise routine, Intensity: Mild  Goals Addressed              This Visit's Progress     Patient Stated   .  Maintain Healthy Lifestyle (pt-stated)        Stay active Healthy diet      Depression Screen PHQ 2/9 Scores 10/22/2020 08/27/2020 12/11/2019 06/09/2019 02/01/2019 12/02/2018 02/12/2017  PHQ - 2 Score 0 0 0 0 0 0 0  PHQ- 9 Score - - - - - 0 -    Fall  Risk Fall Risk  10/22/2020 08/27/2020 12/11/2019 06/09/2019 02/01/2019  Falls in the past year? 0 0 0 0 0  Number falls in past yr: 0 0 0 0 -  Injury with Fall? 0 - - - -  Follow up Falls evaluation completed Falls evaluation completed Falls evaluation completed Falls evaluation completed -    FALL RISK PREVENTION PERTAINING TO THE HOME: Handrails in use when climbing stairs? Yes Home free of loose throw rugs in walkways, pet beds, electrical cords, etc?  Yes  Adequate lighting in your home to reduce risk of falls? Yes   ASSISTIVE DEVICES UTILIZED TO PREVENT FALLS: Life alert? No  Use of a cane, walker or w/c? No   TIMED UP AND GO: Was the test performed? Yes .  Length of time to ambulate 10 feet: 10 sec.   Gait steady and fast without use of assistive device  Cognitive Function:  Patient is alert and oriented x3.  Denies difficulty focusing, making decisions, memory loss.  Enjoys socializing, sodoku and other brain health activities.  MMSE/6CIT deferred. Normal by direct communication/observation.    6CIT Screen 02/01/2019  What Year? 0 points  What month? 0 points  What time? 0 points  Count back from 20 0 points  Months in reverse 0 points  Repeat phrase 0 points  Total Score 0    Immunizations Immunization History  Administered Date(s) Administered  . Influenza, High Dose Seasonal PF 03/05/2017, 02/21/2018  . Influenza,inj,Quad PF,6+ Mos 01/29/2019  . Influenza-Unspecified 02/19/2020  . PFIZER(Purple Top)SARS-COV-2 Vaccination 06/30/2019, 07/21/2019, 09/07/2020  . Pneumococcal Conjugate-13 01/01/2017  . Pneumococcal Polysaccharide-23 02/23/2018  . Tdap 01/01/2017   Health Maintenance Health Maintenance  Topic Date Due  . Zoster Vaccines- Shingrix (1 of 2) 01/22/2021 (Originally 05/22/1997)  . INFLUENZA VACCINE  12/16/2020  . COLONOSCOPY (Pts 45-58yr Insurance coverage will need to be confirmed)  04/27/2023  . TETANUS/TDAP  01/02/2027  . COVID-19 Vaccine  Completed   . Hepatitis C Screening  Completed  . PNA vac Low Risk Adult  Completed  . Pneumococcal Vaccine [id number] old  Aged Out  . HPV VACCINES  Aged Out   Colorectal cancer screening: Type of screening: Colonoscopy. Completed 04/26/20. Repeat every 3 years  Lung Cancer Screening: (Low Dose CT Chest recommended if Age 73-80years, 30 pack-year currently smoking OR have quit w/in 15years.) does not qualify.   Vision Screening: Recommended annual ophthalmology exams for early detection of glaucoma and other disorders of the eye. Is the patient up to date with their annual eye exam?  Yes   Dental Screening: Recommended annual dental exams for proper oral hygiene.  Community Resource Referral / Chronic Care Management: CRR required this visit?  No   CCM required this visit?  No      Plan:   Keep all routine maintenance appointments.   I have personally reviewed and noted the following in the patient's chart:   . Medical and social history . Use of alcohol, tobacco or illicit drugs  . Current medications and supplements including opioid prescriptions. Patient is not currently taking opioid.  . Functional ability and status . Nutritional status . Physical activity . Advanced directives . List of other physicians . Hospitalizations, surgeries, and ER visits in previous 12 months . Vitals . Screenings to include cognitive, depression, and falls . Referrals and appointments  In addition, I have reviewed and discussed with patient certain preventive protocols, quality metrics, and best practice recommendations. A written personalized care plan for preventive services as well as general preventive health recommendations were provided to patient.     OVarney Biles LPN   [id number]    Agree with plan. MMable Paris NP

## 2020-10-31 DIAGNOSIS — G4733 Obstructive sleep apnea (adult) (pediatric): Secondary | ICD-10-CM | POA: Diagnosis not present

## 2020-11-30 DIAGNOSIS — G4733 Obstructive sleep apnea (adult) (pediatric): Secondary | ICD-10-CM | POA: Diagnosis not present

## 2020-12-31 DIAGNOSIS — G4733 Obstructive sleep apnea (adult) (pediatric): Secondary | ICD-10-CM | POA: Diagnosis not present

## 2021-01-17 ENCOUNTER — Encounter: Payer: Self-pay | Admitting: Family Medicine

## 2021-01-21 NOTE — Telephone Encounter (Signed)
Please call him. I need more details on this. Does this mean he tested positive? Is he having any symptoms? Where did he travel? Was he exposed to [place]?

## 2021-01-30 ENCOUNTER — Other Ambulatory Visit: Payer: Self-pay | Admitting: Family Medicine

## 2021-02-03 DIAGNOSIS — G4733 Obstructive sleep apnea (adult) (pediatric): Secondary | ICD-10-CM | POA: Diagnosis not present

## 2021-05-05 DIAGNOSIS — G4733 Obstructive sleep apnea (adult) (pediatric): Secondary | ICD-10-CM | POA: Diagnosis not present

## 2021-06-05 DIAGNOSIS — G4733 Obstructive sleep apnea (adult) (pediatric): Secondary | ICD-10-CM | POA: Diagnosis not present

## 2021-06-09 DIAGNOSIS — H5203 Hypermetropia, bilateral: Secondary | ICD-10-CM | POA: Diagnosis not present

## 2021-06-17 DIAGNOSIS — D485 Neoplasm of uncertain behavior of skin: Secondary | ICD-10-CM | POA: Diagnosis not present

## 2021-06-17 DIAGNOSIS — D2271 Melanocytic nevi of right lower limb, including hip: Secondary | ICD-10-CM | POA: Diagnosis not present

## 2021-06-17 DIAGNOSIS — L570 Actinic keratosis: Secondary | ICD-10-CM | POA: Diagnosis not present

## 2021-06-17 DIAGNOSIS — X32XXXA Exposure to sunlight, initial encounter: Secondary | ICD-10-CM | POA: Diagnosis not present

## 2021-06-17 DIAGNOSIS — C44212 Basal cell carcinoma of skin of right ear and external auricular canal: Secondary | ICD-10-CM | POA: Diagnosis not present

## 2021-06-17 DIAGNOSIS — D2262 Melanocytic nevi of left upper limb, including shoulder: Secondary | ICD-10-CM | POA: Diagnosis not present

## 2021-06-17 DIAGNOSIS — D2261 Melanocytic nevi of right upper limb, including shoulder: Secondary | ICD-10-CM | POA: Diagnosis not present

## 2021-06-17 DIAGNOSIS — Z85828 Personal history of other malignant neoplasm of skin: Secondary | ICD-10-CM | POA: Diagnosis not present

## 2021-07-06 DIAGNOSIS — G4733 Obstructive sleep apnea (adult) (pediatric): Secondary | ICD-10-CM | POA: Diagnosis not present

## 2021-07-25 ENCOUNTER — Other Ambulatory Visit: Payer: Self-pay | Admitting: Family Medicine

## 2021-07-28 DIAGNOSIS — L578 Other skin changes due to chronic exposure to nonionizing radiation: Secondary | ICD-10-CM | POA: Diagnosis not present

## 2021-07-28 DIAGNOSIS — L814 Other melanin hyperpigmentation: Secondary | ICD-10-CM | POA: Diagnosis not present

## 2021-07-28 DIAGNOSIS — C44212 Basal cell carcinoma of skin of right ear and external auricular canal: Secondary | ICD-10-CM | POA: Diagnosis not present

## 2021-07-28 DIAGNOSIS — H61111 Acquired deformity of pinna, right ear: Secondary | ICD-10-CM | POA: Diagnosis not present

## 2021-07-28 DIAGNOSIS — L988 Other specified disorders of the skin and subcutaneous tissue: Secondary | ICD-10-CM | POA: Diagnosis not present

## 2021-08-04 DIAGNOSIS — G4733 Obstructive sleep apnea (adult) (pediatric): Secondary | ICD-10-CM | POA: Diagnosis not present

## 2021-08-29 ENCOUNTER — Ambulatory Visit (INDEPENDENT_AMBULATORY_CARE_PROVIDER_SITE_OTHER): Payer: Medicare HMO | Admitting: Family Medicine

## 2021-08-29 ENCOUNTER — Encounter: Payer: Self-pay | Admitting: Family Medicine

## 2021-08-29 VITALS — BP 120/60 | HR 75 | Temp 98.5°F | Ht 70.0 in | Wt 199.4 lb

## 2021-08-29 DIAGNOSIS — Z0000 Encounter for general adult medical examination without abnormal findings: Secondary | ICD-10-CM | POA: Diagnosis not present

## 2021-08-29 DIAGNOSIS — E663 Overweight: Secondary | ICD-10-CM | POA: Insufficient documentation

## 2021-08-29 DIAGNOSIS — E782 Mixed hyperlipidemia: Secondary | ICD-10-CM

## 2021-08-29 DIAGNOSIS — Z0001 Encounter for general adult medical examination with abnormal findings: Secondary | ICD-10-CM | POA: Insufficient documentation

## 2021-08-29 LAB — COMPREHENSIVE METABOLIC PANEL
ALT: 20 U/L (ref 0–53)
AST: 25 U/L (ref 0–37)
Albumin: 4.2 g/dL (ref 3.5–5.2)
Alkaline Phosphatase: 79 U/L (ref 39–117)
BUN: 33 mg/dL — ABNORMAL HIGH (ref 6–23)
CO2: 26 mEq/L (ref 19–32)
Calcium: 9.7 mg/dL (ref 8.4–10.5)
Chloride: 107 mEq/L (ref 96–112)
Creatinine, Ser: 1.37 mg/dL (ref 0.40–1.50)
GFR: 50.9 mL/min — ABNORMAL LOW (ref >60.00)
Glucose, Bld: 111 mg/dL — ABNORMAL HIGH (ref 70–99)
Potassium: 4.8 mEq/L (ref 3.5–5.1)
Sodium: 139 mEq/L (ref 135–145)
Total Bilirubin: 0.4 mg/dL (ref 0.2–1.2)
Total Protein: 6.9 g/dL (ref 6.0–8.3)

## 2021-08-29 LAB — LIPID PANEL
Cholesterol: 153 mg/dL (ref 0–200)
HDL: 60.1 mg/dL (ref >39.00)
LDL Cholesterol: 78 mg/dL (ref 0–99)
NonHDL: 92.97
Total CHOL/HDL Ratio: 3
Triglycerides: 73 mg/dL (ref 0.0–149.0)
VLDL: 14.6 mg/dL (ref 0.0–40.0)

## 2021-08-29 LAB — HEMOGLOBIN A1C: Hgb A1c MFr Bld: 5.7 % (ref 4.6–6.5)

## 2021-08-29 NOTE — Assessment & Plan Note (Signed)
Physical exam completed.  Encouraged continued healthy diet and exercise.  Discussed that he is aged out of prostate cancer screening.  Colon cancer screening is up-to-date.  Advised that the updated COVID booster is available.  Discussed getting the Shingrix vaccine.  Lab work as outlined.  He will follow-up with his Mohs surgeon as planned. ?

## 2021-08-29 NOTE — Patient Instructions (Signed)
Nice to see you. ?Please continue healthy diet and exercise. ?Please consider getting the Shingrix vaccine and the updated COVID booster. ?We will get lab work today and contact you with the results. ?

## 2021-08-29 NOTE — Progress Notes (Signed)
?Terry Rumps, MD ?Phone: [phone] ? ?Terry Cameron is a 76 y.o. male who presents today for CPE. ? ?Diet: Generally healthy, eats granola, cereal, and fruit for breakfast, has a wrap for lunch, cooks for dinner and typically has vegetables.  No soda or sweet tea.  Occasional sweets and fried foods. ?Exercise: Golf, pickleball, or cardio 4 days a week ?Colonoscopy: 04/26/2020 3-year recall ?Prostate cancer screening: Aged out ?Family history- ? Prostate cancer: no ? Colon cancer: no ?Vaccines-  ? Flu: out of season ? Tetanus: UTD ? Shingles: due ? COVID19: x3 ? Pneumonia: UTD ?Hep C Screening: UTD ?Tobacco use: no ?Alcohol use: 6/week ?Illicit Drug use: no ?Dentist: yes ?Ophthalmology: yes ?Recently had Mohs surgery on his right ear.  He follows up with them in the near future. ? ? ?Active Ambulatory Problems  ?  Diagnosis Date Noted  ? Low back pain 08/08/2015  ? 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 08/29/2015  ? Hyperlipidemia 01/01/2017  ? OSA (obstructive sleep apnea) 01/01/2017  ? Prediabetes 10/15/2017  ? Trigger little finger of left hand 06/03/2018  ? Erectile dysfunction 06/03/2018  ? Skin fragility 12/02/2018  ? Tinnitus 06/09/2019  ? Digital mucous cyst of left hand 07/14/2018  ? Trigger finger of left hand 07/14/2018  ? Abrasion of palm 08/27/2020  ? Osteoarthritis 08/27/2020  ? Routine general medical examination at a health care facility 08/29/2021  ? Overweight 08/29/2021  ? ?Resolved Ambulatory Problems  ?  Diagnosis Date Noted  ? Colon cancer screening 01/01/2017  ? Need for hepatitis C screening test 01/01/2017  ? Epistaxis 02/12/2017  ? Chest tightness 06/12/2017  ? Tennis elbow 10/15/2017  ? Iron deficiency 12/02/2018  ? ?Past Medical History:  ?Diagnosis Date  ? Arthritis   ? Chicken pox   ? Emphysema of lung ([place])   ? Sleep apnea   ? ? ?Family History  ?Problem Relation Age of Onset  ? Hypertension Father   ? Heart attack Father   ? Diabetes Maternal Grandmother   ? Glaucoma  Maternal Grandmother   ? Arthritis Mother   ? ? ?Social History  ? ?Socioeconomic History  ? Marital status: Married  ?  Spouse name: Not on file  ? Number of children: Not on file  ? Years of education: Not on file  ? Highest education level: Not on file  ?Occupational History  ? Not on file  ?Tobacco Use  ? Smoking status: Former  ?  Types: Cigarettes  ?  Quit date: 73  ?  Years since quitting: 49.3  ? Smokeless tobacco: Never  ? Tobacco comments:  ?  SMOKED FOR ABOUT 8 YEARS  ?Vaping Use  ? Vaping Use: Never used  ?Substance and Sexual Activity  ? Alcohol use: Yes  ?  Comment: COUPLE TIMES A WEEK   ? Drug use: No  ? Sexual activity: Not on file  ?Other Topics Concern  ? Not on file  ?Social History Narrative  ? Not on file  ? ?Social Determinants of Health  ? ?Financial Resource Strain: Low Risk   ? Difficulty of Paying Living Expenses: Not hard at all  ?Food Insecurity: No Food Insecurity  ? Worried About Charity fundraiser in the Last Year: Never true  ? Ran Out of Food in the Last Year: Never true  ?Transportation Needs: No Transportation Needs  ? Lack of Transportation (Medical): No  ? Lack of Transportation (Non-Medical): No  ?Physical Activity: Insufficiently Active  ? Days  of Exercise per Week: 4 days  ? Minutes of Exercise per Session: 30 min  ?Stress: No Stress Concern Present  ? Feeling of Stress : Not at all  ?Social Connections: Unknown  ? Frequency of Communication with Friends and Family: Not on file  ? Frequency of Social Gatherings with Friends and Family: Not on file  ? Attends Religious Services: Not on file  ? Active Member of Clubs or Organizations: Not on file  ? Attends Archivist Meetings: Not on file  ? Marital Status: Married  ?Intimate Partner Violence: Not At Risk  ? Fear of Current or Ex-Partner: No  ? Emotionally Abused: No  ? Physically Abused: No  ? Sexually Abused: No  ? ? ?ROS ? ?General:  Negative for nexplained weight loss, fever ?Skin: Negative for new or changing  mole, sore that won't heal ?HEENT: Negative for trouble hearing, trouble seeing, ringing in ears, mouth sores, hoarseness, change in voice, dysphagia. ?CV:  Negative for chest pain, dyspnea, edema, palpitations ?Resp: Negative for cough, dyspnea, hemoptysis ?GI: Negative for nausea, vomiting, diarrhea, constipation, abdominal pain, melena, hematochezia. ?GU: Negative for dysuria, incontinence, urinary hesitance, hematuria, vaginal or penile discharge, polyuria, sexual difficulty, lumps in testicle or breasts ?MSK: Negative for muscle cramps or aches, joint pain or swelling ?Neuro: Negative for headaches, weakness, numbness, dizziness, passing out/fainting ?Psych: Negative for depression, anxiety, memory problems ? ?Objective ? ?Physical Exam ?Vitals:  ? 08/29/21 1049  ?BP: 120/60  ?Pulse: 75  ?Temp: 98.5 ?F (36.9 ?C)  ?SpO2: 99%  ? ? ?BP Readings from Last 3 Encounters:  ?08/29/21 120/60  ?10/22/20 120/75  ?08/27/20 120/75  ? ?Wt Readings from Last 3 Encounters:  ?08/29/21 199 lb 6.4 oz (90.4 kg)  ?10/22/20 186 lb 12.8 oz (84.7 kg)  ?08/27/20 186 lb 3.2 oz (84.5 kg)  ? ? ?Physical Exam ?Constitutional:   ?   General: He is not in acute distress. ?   Appearance: He is not diaphoretic.  ?HENT:  ?   Head: Normocephalic and atraumatic.  ?   Ears:  ? ?Cardiovascular:  ?   Rate and Rhythm: Normal rate and regular rhythm.  ?   Heart sounds: Normal heart sounds.  ?Pulmonary:  ?   Effort: Pulmonary effort is normal.  ?   Breath sounds: Normal breath sounds.  ?Abdominal:  ?   General: Bowel sounds are normal. There is no distension.  ?   Palpations: Abdomen is soft.  ?   Tenderness: There is no abdominal tenderness. There is no guarding or rebound.  ?Musculoskeletal:  ?   Right lower leg: No edema.  ?   Left lower leg: No edema.  ?Lymphadenopathy:  ?   Cervical: No cervical adenopathy.  ?Skin: ?   General: Skin is warm and dry.  ?Neurological:  ?   Mental Status: He is alert.  ?Psychiatric:     ?   Mood and Affect: Mood  normal.  ? ? ? ?Assessment/Plan:  ? ?Problem List Items Addressed This Visit   ? ? Hyperlipidemia  ? Relevant Orders  ? Lipid panel  ? Comp Met (CMET)  ? Overweight  ? Relevant Orders  ? HgB A1c  ? Routine general medical examination at a health care facility - Primary  ?  Physical exam completed.  Encouraged continued healthy diet and exercise.  Discussed that he is aged out of prostate cancer screening.  Colon cancer screening is up-to-date.  Advised that the updated COVID booster is available.  Discussed  getting the Shingrix vaccine.  Lab work as outlined.  He will follow-up with his Mohs surgeon as planned. ? ?  ?  ? ? ?Return in about 6 months (around 02/28/2022) for OSA. ? ?This visit occurred during the SARS-CoV-2 public health emergency.  Safety protocols were in place, including screening questions prior to the visit, additional usage of staff PPE, and extensive cleaning of exam room while observing appropriate contact time as indicated for disinfecting solutions.  ? ? ?Terry Rumps, MD ?[place] ? ?

## 2021-08-30 ENCOUNTER — Encounter: Payer: Self-pay | Admitting: Family Medicine

## 2021-09-01 ENCOUNTER — Other Ambulatory Visit: Payer: Self-pay

## 2021-09-01 DIAGNOSIS — N179 Acute kidney failure, unspecified: Secondary | ICD-10-CM

## 2021-09-05 ENCOUNTER — Encounter: Payer: Self-pay | Admitting: Family Medicine

## 2021-09-05 ENCOUNTER — Telehealth: Payer: Self-pay | Admitting: Family Medicine

## 2021-09-05 NOTE — Telephone Encounter (Signed)
Dropped off forms for Recreation and Romilda Garret (located in provider folder at the front) ?

## 2021-09-09 NOTE — Telephone Encounter (Signed)
Signed.

## 2021-09-09 NOTE — Telephone Encounter (Signed)
I called and LVM for patient to call back to tell him that his forms for [facility] recreation and park is available at the front for him to pickup Ryleigh Buenger,cma .   ?

## 2021-09-22 ENCOUNTER — Other Ambulatory Visit (INDEPENDENT_AMBULATORY_CARE_PROVIDER_SITE_OTHER): Payer: Medicare HMO

## 2021-09-22 ENCOUNTER — Encounter: Payer: Self-pay | Admitting: Family Medicine

## 2021-09-22 DIAGNOSIS — N179 Acute kidney failure, unspecified: Secondary | ICD-10-CM

## 2021-09-22 LAB — BASIC METABOLIC PANEL
BUN: 24 mg/dL — ABNORMAL HIGH (ref 6–23)
CO2: 27 mEq/L (ref 19–32)
Calcium: 9.2 mg/dL (ref 8.4–10.5)
Chloride: 107 mEq/L (ref 96–112)
Creatinine, Ser: 1.11 mg/dL (ref 0.40–1.50)
GFR: 65.49 mL/min (ref >60.00)
Glucose, Bld: 98 mg/dL (ref 70–99)
Potassium: 4.7 mEq/L (ref 3.5–5.1)
Sodium: 139 mEq/L (ref 135–145)

## 2021-09-23 ENCOUNTER — Encounter: Payer: Self-pay | Admitting: Family Medicine

## 2021-10-22 ENCOUNTER — Telehealth: Payer: Self-pay | Admitting: Family Medicine

## 2021-10-22 NOTE — Telephone Encounter (Signed)
Copied from [place] [id number]. Topic: Medicare AWV >> Oct 22, 2021  9:58 AM Harris-Coley, Hannah Beat wrote: Reason for CRM: Left message for patient to schedule Annual Wellness Visit.  Please schedule with Nurse Health Advisor Denisa O'Brien-Blaney, LPN at [facility].  Please call [phone] ask for [facility]

## 2021-11-02 DIAGNOSIS — G4733 Obstructive sleep apnea (adult) (pediatric): Secondary | ICD-10-CM | POA: Diagnosis not present

## 2021-11-07 ENCOUNTER — Other Ambulatory Visit: Payer: Self-pay | Admitting: Family

## 2021-11-14 ENCOUNTER — Ambulatory Visit (INDEPENDENT_AMBULATORY_CARE_PROVIDER_SITE_OTHER): Payer: Medicare HMO

## 2021-11-14 VITALS — Ht 70.0 in | Wt 199.0 lb

## 2021-11-14 DIAGNOSIS — Z0000 Encounter for general adult medical examination without abnormal findings: Secondary | ICD-10-CM | POA: Diagnosis not present

## 2021-11-14 NOTE — Progress Notes (Signed)
Subjective:   ARAM DOMZALSKI is a 75 y.o. male who presents for Medicare Annual/Subsequent preventive examination.  Review of Systems    No ROS.  Medicare Wellness Virtual Visit.  Visual/audio telehealth visit, UTA vital signs.   See social history for additional risk factors.   Cardiac Risk Factors include: male gender;advanced age (>67mn, >>38women)     Objective:    Today's Vitals   11/14/21 0839  Weight: 199 lb (90.3 kg)  Height: '5\' 10"'  (1.778 m)   Body mass index is 28.55 kg/m.     11/14/2021    8:52 AM 10/22/2020    2:55 PM 04/26/2020    7:31 AM 02/01/2019    8:49 AM  Advanced Directives  Does Patient Have a Medical Advance Directive? Yes Yes Yes Yes  Type of [place]  Does patient want to make changes to medical advance directive? No - Patient declined No - Patient declined  No - Patient declined  Copy of HBig Well[place] Chart? No - copy requested Yes - validated most recent copy scanned in chart (See row information)      Current Medications (verified) Outpatient Encounter Medications as of 11/14/2021  Medication Sig   atorvastatin (LIPITOR) 10 MG tablet TAKE 1 TABLET BY MOUTH EVERY DAY   meloxicam (MOBIC) 15 MG tablet Take 15 mg by mouth daily.   SUPREP BOWEL PREP KIT 17.5-3.13-1.6 GM/177ML SOLN Take by mouth. (Patient not taking: Reported on 08/29/2021)   No facility-administered encounter medications on file as of 11/14/2021.    Allergies (verified) Patient has no known allergies.   History: Past Medical History:  Diagnosis Date   Arthritis    Chicken pox    [place] of lung [place]    Hyperlipidemia    Sleep apnea    Past Surgical History:  Procedure Laterality Date   BASAL CELL CARCINOMA EXCISION     COLONOSCOPY WITH PROPOFOL N/A 02/22/2017   Procedure: COLONOSCOPY WITH PROPOFOL;  Surgeon: AJonathon Bellows MD;  [facility]   Service: Gastroenterology;  Laterality: N/A;   COLONOSCOPY WITH PROPOFOL N/A 04/26/2020   Procedure: COLONOSCOPY WITH PROPOFOL;  Surgeon: AJonathon Bellows MD;  [facility];  Service: Gastroenterology;  Laterality: N/A;   Family History  Problem Relation Age of Onset   Hypertension Father    Heart attack Father    Diabetes Maternal Grandmother    Glaucoma Maternal Grandmother    Arthritis Mother    Social History   Socioeconomic History   Marital status: Married    Spouse name: Not on file   Number of children: Not on file   Years of education: Not on file   Highest education level: Not on file  Occupational History   Not on file  Tobacco Use   Smoking status: Former    Types: Cigarettes    Quit date: 139   Years since quitting: 49.5   Smokeless tobacco: Never   Tobacco comments:    SMOKED FOR ABOUT 8 YEARS  Vaping Use   Vaping Use: Never used  Substance and Sexual Activity   Alcohol use: Yes    Comment: COUPLE TIMES A WEEK    Drug use: No   Sexual activity: Not on file  Other Topics Concern   Not on file  Social History Narrative   Not on file   Social Determinants of Health   Financial Resource Strain: Low Risk  (  11/14/2021)   Overall Financial Resource Strain (CARDIA)    Difficulty of Paying Living Expenses: Not hard at all  Food Insecurity: No Food Insecurity (11/14/2021)   Hunger Vital Sign    Worried About Running Out of Food in the Last Year: Never true    Ran Out of Food in the Last Year: Never true  Transportation Needs: No Transportation Needs (11/14/2021)   PRAPARE - Hydrologist (Medical): No    Lack of Transportation (Non-Medical): No  Physical Activity: Insufficiently Active (11/14/2021)   Exercise Vital Sign    Days of Exercise per Week: 4 days    Minutes of Exercise per Session: 30 min  Stress: No Stress Concern Present (11/14/2021)   [place]    Feeling of Stress : Not at all  Social Connections: Unknown (11/14/2021)   Social Connection and Isolation Panel [NHANES]    Frequency of Communication with Friends and Family: Not on file    Frequency of Social Gatherings with Friends and Family: Not on file    Attends Religious Services: Not on file    Active Member of Clubs or Organizations: Not on file    Attends Archivist Meetings: Not on file    Marital Status: Married    Tobacco Counseling Counseling given: Not Answered Tobacco comments: SMOKED FOR ABOUT 8 YEARS   Clinical Intake:  Pre-visit preparation completed: Yes        Diabetes: No  How often do you need to have someone help you when you read instructions, pamphlets, or other written materials from your doctor or pharmacy?: 1 - Never  Interpreter Needed?: No      Activities of Daily Living    11/14/2021    8:46 AM  In your present state of health, do you have any difficulty performing the following activities:  Hearing? 1  Vision? 0  Difficulty concentrating or making decisions? 0  Walking or climbing stairs? 0  Dressing or bathing? 0  Doing errands, shopping? 0  Preparing Food and eating ? N  Using the Toilet? N  In the past six months, have you accidently leaked urine? N  Do you have problems with loss of bowel control? N  Managing your Medications? N  Managing your Finances? N  Housekeeping or managing your Housekeeping? N    Patient Care Team: Leone Haven, MD as PCP - General (Family Medicine)  Indicate any recent Medical Services you may have received from other than [facility] providers in the past year (date may be approximate).     Assessment:   This is a routine wellness examination for [place].  Virtual Visit via Telephone Note  I connected with  Dava Najjar on 11/14/21 at  8:30 AM EDT by telephone and verified that I am speaking with the correct person using two identifiers.  Persons  participating in the virtual visit: patient/Nurse Health Advisor   I discussed the limitations of performing an evaluation and management service by telehealth.We continued and completed visit with audio only. Some vital signs may be absent or patient reported.   Hearing/Vision screen Hearing Screening - Comments:: Hearing aids Vision Screening - Comments:: Wears corrective lenses, multi-focus. They have seen their ophthalmologist in the last 12 months. Followed by [facility].   Dietary issues and exercise activities discussed: Current Exercise Habits: Home exercise routine, Type of exercise: walking (Golfing), Intensity: Mild   Goals Addressed  This Visit's Progress     Patient Stated     Maintain Healthy Lifestyle (pt-stated)        Stay active Healthy diet/Keto        Depression Screen    11/14/2021    8:50 AM 10/22/2020    2:56 PM 08/27/2020   12:12 PM 12/11/2019    9:33 AM 06/09/2019    9:26 AM 02/01/2019    8:54 AM 12/02/2018    1:04 PM  PHQ 2/9 Scores  PHQ - 2 Score 0 0 0 0 0 0 0  PHQ- 9 Score       0    Fall Risk    11/14/2021    8:45 AM 10/22/2020    2:56 PM 08/27/2020   12:12 PM 12/11/2019    9:33 AM 06/09/2019    9:25 AM  [place] in the past year? 0 0 0 0 0  Number falls in past yr:  0 0 0 0  Injury with Fall?  0     Follow up Falls evaluation completed Falls evaluation completed Falls evaluation completed Falls evaluation completed Falls evaluation completed    [place]: Home free of loose throw rugs in walkways, pet beds, electrical cords, etc? Yes  Adequate lighting in your home to reduce risk of falls? Yes   ASSISTIVE DEVICES UTILIZED TO PREVENT FALLS: Life alert? No  Use of a cane, walker or w/c? No   TIMED UP AND GO: Was the test performed? No .   Cognitive Function:  Patient is alert and oriented x3.  Enjoys sodoku and other brain health stimulating activities.       02/01/2019     8:59 AM  6CIT Screen  What Year? 0 points  What month? 0 points  What time? 0 points  Count back from 20 0 points  Months in reverse 0 points  Repeat phrase 0 points  Total Score 0 points    Immunizations Immunization History  Administered Date(s) Administered   Influenza, High Dose Seasonal PF 03/05/2017, 02/21/2018   Influenza,inj,Quad PF,6+ Mos 01/29/2019   Influenza-Unspecified 02/19/2020   Moderna Covid-19 Vaccine Bivalent Booster 38yr & up 09/22/2021   PFIZER Comirnaty(Gray Top)Covid-19 Tri-Sucrose Vaccine 09/07/2020   PFIZER(Purple Top)SARS-COV-2 Vaccination 06/30/2019, 07/21/2019, 09/07/2020   Pneumococcal Conjugate-13 01/01/2017   Pneumococcal Polysaccharide-23 02/23/2018   Tdap 01/01/2017   Zoster Recombinat (Shingrix) 09/22/2021   Screening Tests Health Maintenance  Topic Date Due   Zoster Vaccines- Shingrix (2 of 2) 11/17/2021   INFLUENZA VACCINE  12/16/2021   COLONOSCOPY (Pts 45-435yrInsurance coverage will need to be confirmed)  04/27/2023   TETANUS/TDAP  01/02/2027   Pneumonia Vaccine 6593Years old  Completed   COVID-19 Vaccine  Completed   Hepatitis C Screening  Completed   HPV VACCINES  Aged Out   Health Maintenance There are no preventive care reminders to display for this patient.  Lung Cancer Screening: (Low Dose CT Chest recommended if Age 74-80ears, 30 pack-year currently smoking OR have quit w/in 15years.) does not qualify.   Vision Screening: Recommended annual ophthalmology exams for early detection of glaucoma and other disorders of the eye.  Dental Screening: Recommended annual dental exams for proper oral hygiene  Community Resource Referral / Chronic Care Management: CRR required this visit?  No   CCM required this visit?  No      Plan:   Keep all routine maintenance appointments.   I have personally reviewed  and noted the following in the patient's chart:   Medical and social history Use of alcohol, tobacco or illicit drugs   Current medications and supplements including opioid prescriptions. Patient is not currently taking opioid prescriptions. Functional ability and status Nutritional status Physical activity Advanced directives List of other physicians Hospitalizations, surgeries, and ER visits in previous 12 months Vitals Screenings to include cognitive, depression, and falls Referrals and appointments  In addition, I have reviewed and discussed with patient certain preventive protocols, quality metrics, and best practice recommendations. A written personalized care plan for preventive services as well as general preventive health recommendations were provided to patient.     Varney Biles, LPN   [id number]

## 2021-11-14 NOTE — Patient Instructions (Addendum)
  Terry Cameron , Thank you for taking time to come for your Medicare Wellness Visit. I appreciate your ongoing commitment to your health goals. Please review the following plan we discussed and let me know if I can assist you in the future.   These are the goals we discussed:  Goals       Patient Stated     Maintain Healthy Lifestyle (pt-stated)      Stay active Healthy diet/Keto         This is a list of the screening recommended for you and due dates:  Health Maintenance  Topic Date Due   Zoster (Shingles) Vaccine (2 of 2) 11/17/2021   Flu Shot  12/16/2021   Colon Cancer Screening  04/27/2023   Tetanus Vaccine  01/02/2027   Pneumonia Vaccine  Completed   COVID-19 Vaccine  Completed   Hepatitis C Screening: USPSTF Recommendation to screen - Ages 18-79 yo.  Completed   HPV Vaccine  Aged Out

## 2021-12-10 ENCOUNTER — Ambulatory Visit (INDEPENDENT_AMBULATORY_CARE_PROVIDER_SITE_OTHER): Payer: Medicare HMO | Admitting: Family Medicine

## 2021-12-10 ENCOUNTER — Encounter: Payer: Self-pay | Admitting: Family Medicine

## 2021-12-10 VITALS — BP 120/70 | HR 82 | Temp 97.8°F | Ht 70.0 in | Wt 197.8 lb

## 2021-12-10 DIAGNOSIS — R809 Proteinuria, unspecified: Secondary | ICD-10-CM | POA: Diagnosis not present

## 2021-12-10 DIAGNOSIS — J309 Allergic rhinitis, unspecified: Secondary | ICD-10-CM | POA: Insufficient documentation

## 2021-12-10 DIAGNOSIS — R7303 Prediabetes: Secondary | ICD-10-CM | POA: Diagnosis not present

## 2021-12-10 DIAGNOSIS — G4733 Obstructive sleep apnea (adult) (pediatric): Secondary | ICD-10-CM | POA: Diagnosis not present

## 2021-12-10 DIAGNOSIS — S20362A Insect bite (nonvenomous) of left front wall of thorax, initial encounter: Secondary | ICD-10-CM

## 2021-12-10 DIAGNOSIS — W57XXXA Bitten or stung by nonvenomous insect and other nonvenomous arthropods, initial encounter: Secondary | ICD-10-CM | POA: Diagnosis not present

## 2021-12-10 DIAGNOSIS — R350 Frequency of micturition: Secondary | ICD-10-CM | POA: Diagnosis not present

## 2021-12-10 DIAGNOSIS — E782 Mixed hyperlipidemia: Secondary | ICD-10-CM | POA: Diagnosis not present

## 2021-12-10 LAB — POCT URINALYSIS DIPSTICK
Bilirubin, UA: NEGATIVE
Blood, UA: NEGATIVE
Glucose, UA: NEGATIVE
Ketones, UA: 5
Leukocytes, UA: NEGATIVE
Nitrite, UA: NEGATIVE
Protein, UA: POSITIVE — AB
Spec Grav, UA: >=1.03 — AB (ref 1.010–1.025)
Urobilinogen, UA: 0.2 E.U./dL
pH, UA: 5.5 (ref 5.0–8.0)

## 2021-12-10 LAB — POCT GLYCOSYLATED HEMOGLOBIN (HGB A1C): Hemoglobin A1C: 5.3 % (ref 4.0–5.6)

## 2021-12-10 MED ORDER — AZELASTINE HCL 0.1 % NA SOLN: 2.0000 | Freq: Two times a day (BID) | NASAL | 12 refills | Status: DC

## 2021-12-10 NOTE — Progress Notes (Signed)
ri [name], MD Phone: [phone]  Terry Cameron is a 76 y.o. male who presents today for follow-up.  HYPERLIPIDEMIA Symptoms Chest pain on exertion:  no   Leg claudication:   no Medications: Compliance- taking lipitor Right upper quadrant pain- no  Muscle aches- no Lipid Panel     Component Value Date/Time   CHOL 153 08/29/2021 1059   TRIG 73.0 08/29/2021 1059   HDL 60.10 08/29/2021 1059   CHOLHDL 3 08/29/2021 1059   VLDL 14.6 08/29/2021 1059   LDLCALC 78 08/29/2021 1059   LDLDIRECT 86.0 02/05/2017 0922   OSA CPAP use: yes Hypersomnia: no Well rested: yes CPAP company: [facility]  Bug bite: Patient reports he had a bug bite back in April.  There was a red spot on his left chest that was itchy though he notes it has improved significantly at this point.  Urine frequency: Patient notes this has increased recently.  It was particularly increased when he was in [place].  He notes some urgency.  He notes larger volumes of urine.  He notes urine flow is slightly reduced.  He notes occasionally he feels that he has not emptied his bladder.  He notes no dysuria or straining.  Abnormal smell: Patient notes since he was in [place] he has smelled a [place] smell.  Notes that occurs every day.  He does have mild congestion and rhinorrhea.  Occasional blows some mucus out of his nose.  Does have some postnasal drip.  Does not take anything for allergies.   Social History   Tobacco Use  Smoking Status Former   Types: Cigarettes   Quit date: 1974   Years since quitting: 49.5  Smokeless Tobacco Never  Tobacco Comments   SMOKED FOR ABOUT 8 YEARS    Current Outpatient Medications on File Prior to Visit  Medication Sig Dispense Refill   atorvastatin (LIPITOR) 10 MG tablet TAKE 1 TABLET BY MOUTH EVERY DAY 90 tablet 1   meloxicam (MOBIC) 15 MG tablet Take 15 mg by mouth daily.     SUPREP BOWEL PREP KIT 17.5-3.13-1.6 GM/177ML SOLN Take by mouth.     No current  facility-administered medications on file prior to visit.     ROS see history of present illness  Objective  Physical Exam Vitals:   12/10/21 0955  BP: 120/70  Pulse: 82  Temp: 97.8 F (36.6 C)  SpO2: 98%    BP Readings from Last 3 Encounters:  12/10/21 120/70  08/29/21 120/60  10/22/20 120/75   Wt Readings from Last 3 Encounters:  12/10/21 197 lb 12.8 oz (89.7 kg)  11/14/21 199 lb (90.3 kg)  08/29/21 199 lb 6.4 oz (90.4 kg)    Physical Exam Constitutional:      General: He is not in acute distress.    Appearance: He is not diaphoretic.  Cardiovascular:     Rate and Rhythm: Normal rate and regular rhythm.     Heart sounds: Normal heart sounds.  Pulmonary:     Effort: Pulmonary effort is normal.     Breath sounds: Normal breath sounds.  Genitourinary:    Prostate: Normal.     Rectum: Normal.  Skin:    General: Skin is warm and dry.       Neurological:     Mental Status: He is alert.      Assessment/Plan: Please see individual problem list.  Problem List Items Addressed This Visit     Hyperlipidemia (Chronic)    Continue Lipitor 10  mg once daily.      OSA (obstructive sleep apnea) (Chronic)    Symptomatically well-controlled.  He will continue using his CPAP.  We will request a compliance report.      Allergic rhinitis    Patient has allergic rhinitis symptoms.  This could certainly be causing his abnormal smell sensation.  We will trial Astelin and see if that is beneficial.  If the abnormal smell does not resolve with use of the Astelin he will let me know and we can proceed with imaging to evaluate for an underlying cause.      Relevant Medications   azelastine (ASTELIN) 0.1 % nasal spray   Bug bite    No abnormal findings on exam.  He will monitor.      Prediabetes    Check A1c.      Relevant Orders   POCT HgB A1C (Completed)   Urine frequency    Could be related to prediabetes, UTI, or prostate issue.  His prostate feels normal on  exam.  Discussed checking urinalysis and then we can figure out the next step in management.      Relevant Orders   POCT Urinalysis Dipstick (Completed)   Other Visit Diagnoses     Proteinuria, unspecified type    -  Primary   Relevant Orders   Protein / creatinine ratio, urine       Return in about 6 months (around 06/12/2022).   [name], MD [place]

## 2021-12-10 NOTE — Assessment & Plan Note (Signed)
Continue Lipitor 10 mg once daily.

## 2021-12-10 NOTE — Patient Instructions (Signed)
Nice to see you. We will contact you with your urine results. Please try the Astelin nasal spray.  If this does not resolve the abnormal smell sensation please let me know so we can get a scan of your head.

## 2021-12-10 NOTE — Assessment & Plan Note (Signed)
Patient has allergic rhinitis symptoms.  This could certainly be causing his abnormal smell sensation.  We will trial Astelin and see if that is beneficial.  If the abnormal smell does not resolve with use of the Astelin he will let me know and we can proceed with imaging to evaluate for an underlying cause.

## 2021-12-10 NOTE — Assessment & Plan Note (Signed)
No abnormal findings on exam.  He will monitor.

## 2021-12-10 NOTE — Assessment & Plan Note (Signed)
Symptomatically well-controlled.  He will continue using his CPAP.  We will request a compliance report.

## 2021-12-10 NOTE — Assessment & Plan Note (Signed)
Check A1c. 

## 2021-12-10 NOTE — Assessment & Plan Note (Signed)
Could be related to prediabetes, UTI, or prostate issue.  His prostate feels normal on exam.  Discussed checking urinalysis and then we can figure out the next step in management.

## 2021-12-11 LAB — PROTEIN / CREATININE RATIO, URINE
Creatinine, Urine: 243 mg/dL (ref 20–320)
Protein/Creat Ratio: 82 mg/g creat (ref 25–148)
Protein/Creatinine Ratio: 0.082 mg/mg creat (ref 0.025–0.148)
Total Protein, Urine: 20 mg/dL (ref 5–25)

## 2021-12-18 ENCOUNTER — Telehealth: Payer: Self-pay

## 2021-12-18 ENCOUNTER — Encounter: Payer: Self-pay | Admitting: Family Medicine

## 2021-12-18 NOTE — Telephone Encounter (Signed)
I called the patient and informed him of his results and he understood and is willing to try a medication for frequency. You can send it to his pharmacy.  Tylyn Derwin,cma

## 2021-12-18 NOTE — Telephone Encounter (Signed)
Please let the patient know that his urine did not reveal an infection. His prostate felt normal on his exam though it is possible that his prostate could be contributing to his symptoms. Would he want to try medication for this to see if it would help with his frequency?

## 2021-12-18 NOTE — Telephone Encounter (Signed)
Hi What medication were you going to try for this patient I.e overactive bladder mybetriq/gemtasa or Flomax?  Can you send in for patient what you were meaning?  If desired can be sent to his pharmacy out of town

## 2021-12-19 MED ORDER — TAMSULOSIN HCL 0.4 MG PO CAPS: 0.4000 mg | ORAL_CAPSULE | Freq: Every day | ORAL | 3 refills | Status: DC

## 2021-12-19 NOTE — Telephone Encounter (Signed)
I called and spoke with the patient and informed him that the medication was sent to the pharmacy early this morning I LVM for him to call back if he needs anything.  Braniyah Besse,cma

## 2021-12-19 NOTE — Telephone Encounter (Addendum)
I apologize for not getting this sent in sooner. I just sent flomax to the pharmacy. See mychart message. Please contact the patient and let him know this was sent. If he is heading out of town and will be in the [place] I can send it to a pharmacy where he is traveling as well.

## 2021-12-19 NOTE — Telephone Encounter (Signed)
Please add this to his chart. Thanks.

## 2021-12-20 ENCOUNTER — Encounter: Payer: Self-pay | Admitting: Family Medicine

## 2021-12-25 NOTE — Progress Notes (Signed)
I called to [facility] health to get a compliance report on the patients CPAP and they will fax it to this office.  Jakye Mullens,cma

## 2021-12-26 DIAGNOSIS — D225 Melanocytic nevi of trunk: Secondary | ICD-10-CM | POA: Diagnosis not present

## 2021-12-26 DIAGNOSIS — D2272 Melanocytic nevi of left lower limb, including hip: Secondary | ICD-10-CM | POA: Diagnosis not present

## 2021-12-26 DIAGNOSIS — Z85828 Personal history of other malignant neoplasm of skin: Secondary | ICD-10-CM | POA: Diagnosis not present

## 2021-12-26 DIAGNOSIS — L570 Actinic keratosis: Secondary | ICD-10-CM | POA: Diagnosis not present

## 2021-12-26 DIAGNOSIS — D2261 Melanocytic nevi of right upper limb, including shoulder: Secondary | ICD-10-CM | POA: Diagnosis not present

## 2021-12-26 DIAGNOSIS — L578 Other skin changes due to chronic exposure to nonionizing radiation: Secondary | ICD-10-CM | POA: Diagnosis not present

## 2022-01-08 NOTE — Progress Notes (Signed)
Compliance report is in the lab basket for your review.  Fareeda Downard,cma

## 2022-01-12 DIAGNOSIS — H25813 Combined forms of age-related cataract, bilateral: Secondary | ICD-10-CM | POA: Diagnosis not present

## 2022-01-12 DIAGNOSIS — Z0100 Encounter for examination of eyes and vision without abnormal findings: Secondary | ICD-10-CM | POA: Diagnosis not present

## 2022-01-31 DIAGNOSIS — G4733 Obstructive sleep apnea (adult) (pediatric): Secondary | ICD-10-CM | POA: Diagnosis not present

## 2022-02-11 ENCOUNTER — Other Ambulatory Visit: Payer: Self-pay

## 2022-02-11 ENCOUNTER — Encounter: Payer: Self-pay | Admitting: Family Medicine

## 2022-02-11 DIAGNOSIS — E782 Mixed hyperlipidemia: Secondary | ICD-10-CM

## 2022-02-11 MED ORDER — ATORVASTATIN CALCIUM 10 MG PO TABS: 10.0000 mg | ORAL_TABLET | Freq: Every day | ORAL | 1 refills | Status: DC

## 2022-02-25 ENCOUNTER — Encounter: Payer: Self-pay | Admitting: Family Medicine

## 2022-02-25 ENCOUNTER — Telehealth: Payer: Self-pay | Admitting: Family Medicine

## 2022-02-25 DIAGNOSIS — Z0279 Encounter for issue of other medical certificate: Secondary | ICD-10-CM

## 2022-02-25 NOTE — Telephone Encounter (Signed)
Noted. Disregard the mychart message I just sent to you on this.

## 2022-02-25 NOTE — Telephone Encounter (Signed)
Form completed. Please contact patients son to pick up as the patient request in his message.

## 2022-02-25 NOTE — Telephone Encounter (Signed)
Do we have this form? 

## 2022-02-25 NOTE — Telephone Encounter (Signed)
Patient dropped off paper work for a cruise that needs to be filled out by provider. Paper work is up front in Dr [employer].

## 2022-03-02 ENCOUNTER — Ambulatory Visit: Payer: Medicare HMO | Admitting: Family Medicine

## 2022-03-16 ENCOUNTER — Ambulatory Visit: Payer: Medicare HMO | Admitting: Family Medicine

## 2022-03-16 ENCOUNTER — Other Ambulatory Visit: Payer: Self-pay | Admitting: Family Medicine

## 2022-03-20 ENCOUNTER — Ambulatory Visit: Payer: Medicare HMO | Admitting: Family Medicine

## 2022-04-02 ENCOUNTER — Encounter: Payer: Self-pay | Admitting: Primary Care

## 2022-04-02 ENCOUNTER — Ambulatory Visit (INDEPENDENT_AMBULATORY_CARE_PROVIDER_SITE_OTHER): Payer: Medicare HMO | Admitting: Primary Care

## 2022-04-02 VITALS — BP 120/64 | HR 69 | Temp 97.3°F | Ht 70.0 in | Wt 202.0 lb

## 2022-04-02 DIAGNOSIS — J029 Acute pharyngitis, unspecified: Secondary | ICD-10-CM | POA: Diagnosis not present

## 2022-04-02 NOTE — Assessment & Plan Note (Signed)
Likely secondary to either post nasal drip or silent reflux. Exam today unremarkable and not suggestive of infection. No masses or alarm signs.  Start with daily antihistamine. Consider adding famotidine 20 mg HS.  If no improvement then recommend further testing. He will follow up with PCP at that point.

## 2022-04-02 NOTE — Patient Instructions (Signed)
Start an antihistamine such as Claritin, Allegra, or Zyrtec once daily for sore throat.  Also consider adding famotidine 20 mg at bedtime for potential silent acid reflux. This is a common cause for sore throat/cough after an infection.  It was a pleasure meeting you!

## 2022-04-02 NOTE — Progress Notes (Signed)
Subjective:    Patient ID: Terry Cameron, male    DOB: 09-Dec-1945, 76 y.o.   MRN: [id number]  Sore Throat  Associated symptoms include coughing. Pertinent negatives include no congestion.    Terry Cameron is a very pleasant 76 y.o. male patient of Dr. Caryl Bis with a history of OSA, allergic rhinitis, prediabetes, tinnitus who presents today to discuss sore throat.  Symptom onset 2-3 weeks ago while traveling in [place] with dry cough, sore throat, and rhinorrhea, post nasal drip. Since then his symptoms have mostly resolved except for sore throat and mild dry cough. His cough occurs throughout the day, no worse with eating or drinking. No worse at night.   He's tried taking cough drops, gargling with salt water with temporary improvement. He's feeling well overall, was able to play pickle ball last night. He is not on an antihistamine. He denies esophageal burning, belching, fevers.   BP Readings from Last 3 Encounters:  04/02/22 120/64  12/10/21 120/70  08/29/21 120/60      Review of Systems  Constitutional:  Negative for chills, fatigue and fever.  HENT:  Positive for sore throat. Negative for congestion and postnasal drip.   Respiratory:  Positive for cough.          Past Medical History:  Diagnosis Date   Arthritis    Chicken pox    Emphysema of lung ([place])    Hyperlipidemia    Sleep apnea     Social History   Socioeconomic History   Marital status: Married    Spouse name: Not on file   Number of children: Not on file   Years of education: Not on file   Highest education level: Not on file  Occupational History   Not on file  Tobacco Use   Smoking status: Former    Types: Cigarettes    Quit date: 29    Years since quitting: 49.9   Smokeless tobacco: Never   Tobacco comments:    SMOKED FOR ABOUT 8 YEARS  Vaping Use   Vaping Use: Never used  Substance and Sexual Activity   Alcohol use: Yes    Comment: COUPLE TIMES A WEEK    Drug use: No    Sexual activity: Not on file  Other Topics Concern   Not on file  Social History Narrative   Not on file   Social Determinants of Health   Financial Resource Strain: Low Risk  (11/14/2021)   Overall Financial Resource Strain (CARDIA)    Difficulty of Paying Living Expenses: Not hard at all  Food Insecurity: No Food Insecurity (11/14/2021)   Hunger Vital Sign    Worried About Running Out of Food in the Last Year: Never true    Ran Out of Food in the Last Year: Never true  Transportation Needs: No Transportation Needs (11/14/2021)   PRAPARE - Hydrologist (Medical): No    Lack of Transportation (Non-Medical): No  Physical Activity: Insufficiently Active (11/14/2021)   Exercise Vital Sign    Days of Exercise per Week: 4 days    Minutes of Exercise per Session: 30 min  Stress: No Stress Concern Present (11/14/2021)   [place]    Feeling of Stress : Not at all  Social Connections: Unknown (11/14/2021)   Social Connection and Isolation Panel [NHANES]    Frequency of Communication with Friends and Family: Not on file    Frequency of Social  Gatherings with Friends and Family: Not on file    Attends Religious Services: Not on file    Active Member of Clubs or Organizations: Not on file    Attends Club or Organization Meetings: Not on file    Marital Status: Married  Intimate Partner Violence: Not At Risk (11/14/2021)   Humiliation, Afraid, Rape, and Kick questionnaire    Fear of Current or Ex-Partner: No    Emotionally Abused: No    Physically Abused: No    Sexually Abused: No    Past Surgical History:  Procedure Laterality Date   BASAL CELL CARCINOMA EXCISION     COLONOSCOPY WITH PROPOFOL N/A 02/22/2017   Procedure: COLONOSCOPY WITH PROPOFOL;  Surgeon: [name], MD;  Location: [facility] ENDOSCOPY;  Service: Gastroenterology;  Laterality: N/A;   COLONOSCOPY WITH PROPOFOL N/A 04/26/2020    Procedure: COLONOSCOPY WITH PROPOFOL;  Surgeon: [name], MD;  Location: [facility] ENDOSCOPY;  Service: Gastroenterology;  Laterality: N/A;    Family History  Problem Relation Age of Onset   Hypertension Father    Heart attack Father    Diabetes Maternal Grandmother    Glaucoma Maternal Grandmother    Arthritis Mother     No Known Allergies  Current Outpatient Medications on File Prior to Visit  Medication Sig Dispense Refill   atorvastatin (LIPITOR) 10 MG tablet Take 1 tablet (10 mg total) by mouth daily. 90 tablet 1   azelastine (ASTELIN) 0.1 % nasal spray Place 2 sprays into both nostrils 2 (two) times daily. Use in each nostril as directed 30 mL 12   meloxicam (MOBIC) 15 MG tablet Take 15 mg by mouth daily.     tamsulosin (FLOMAX) 0.4 MG CAPS capsule TAKE 1 CAPSULE BY MOUTH EVERY DAY 90 capsule 1   SUPREP BOWEL PREP KIT 17.5-3.13-1.6 GM/177ML SOLN Take by mouth. (Patient not taking: Reported on 04/02/2022)     No current facility-administered medications on file prior to visit.    BP 120/64   Pulse 69   Temp (!) 97.3 F (36.3 C) (Temporal)   Ht _0  (1.778 m)   Wt 202 lb (91.6 kg)   SpO2 99%   BMI 28.98 kg/m  Objective:   Physical Exam Constitutional:      Appearance: He is not ill-appearing.  HENT:     Right Ear: Tympanic membrane and ear canal normal.     Left Ear: Tympanic membrane and ear canal normal.     Nose: No mucosal edema.     Right Sinus: No maxillary sinus tenderness or frontal sinus tenderness.     Left Sinus: No maxillary sinus tenderness or frontal sinus tenderness.     Mouth/Throat:     Mouth: Mucous membranes are moist.     Pharynx: No oropharyngeal exudate or posterior oropharyngeal erythema.  Eyes:     Conjunctiva/sclera: Conjunctivae normal.  Cardiovascular:     Rate and Rhythm: Normal rate and regular rhythm.  Pulmonary:     Effort: Pulmonary effort is normal.     Breath sounds: Normal breath sounds. No wheezing or rales.   Musculoskeletal:     Cervical back: Neck supple.  Lymphadenopathy:     Cervical: No cervical adenopathy.  Skin:    General: Skin is warm and dry.           Assessment & Plan:   Problem List Items Addressed This Visit       Other   Sore throat - Primary    Likely secondary to either post  nasal drip or silent reflux. Exam today unremarkable and not suggestive of infection. No masses or alarm signs.  Start with daily antihistamine. Consider adding famotidine 20 mg HS.  If no improvement then recommend further testing. He will follow up with PCP at that point.          Pleas Koch, NP

## 2022-05-08 DIAGNOSIS — E785 Hyperlipidemia, unspecified: Secondary | ICD-10-CM | POA: Diagnosis not present

## 2022-05-08 DIAGNOSIS — Z8249 Family history of ischemic heart disease and other diseases of the circulatory system: Secondary | ICD-10-CM | POA: Diagnosis not present

## 2022-05-08 DIAGNOSIS — R030 Elevated blood-pressure reading, without diagnosis of hypertension: Secondary | ICD-10-CM | POA: Diagnosis not present

## 2022-05-08 DIAGNOSIS — Z791 Long term (current) use of non-steroidal anti-inflammatories (NSAID): Secondary | ICD-10-CM | POA: Diagnosis not present

## 2022-05-08 DIAGNOSIS — Z008 Encounter for other general examination: Secondary | ICD-10-CM | POA: Diagnosis not present

## 2022-05-08 DIAGNOSIS — J309 Allergic rhinitis, unspecified: Secondary | ICD-10-CM | POA: Diagnosis not present

## 2022-05-08 DIAGNOSIS — Z87891 Personal history of nicotine dependence: Secondary | ICD-10-CM | POA: Diagnosis not present

## 2022-05-08 DIAGNOSIS — N400 Enlarged prostate without lower urinary tract symptoms: Secondary | ICD-10-CM | POA: Diagnosis not present

## 2022-06-08 ENCOUNTER — Ambulatory Visit (INDEPENDENT_AMBULATORY_CARE_PROVIDER_SITE_OTHER): Payer: Medicare HMO | Admitting: Family Medicine

## 2022-06-08 ENCOUNTER — Encounter: Payer: Self-pay | Admitting: Family Medicine

## 2022-06-08 VITALS — BP 126/60 | HR 82 | Temp 97.7°F | Ht 70.0 in | Wt 207.2 lb

## 2022-06-08 DIAGNOSIS — M545 Low back pain, unspecified: Secondary | ICD-10-CM

## 2022-06-08 NOTE — Assessment & Plan Note (Signed)
Patient likely strained a muscle in his back.  Has been improving since yesterday.  He will continue to monitor and can use Tylenol or ibuprofen over-the-counter.  He will let me know if he has any worsening symptoms or new symptoms.  He will complete exercises at home once his back has recovered.

## 2022-06-08 NOTE — Patient Instructions (Signed)
Back Exercises ?The following exercises strengthen the muscles that help to support the trunk (torso) and back. They also help to keep the lower back flexible. Doing these exercises can help to prevent or lessen existing low back pain. ?If you have back pain or discomfort, try doing these exercises 2-3 times each day or as told by your health care provider. ?As your pain improves, do them once each day, but increase the number of times that you repeat the steps for each exercise (do more repetitions). ?To prevent the recurrence of back pain, continue to do these exercises once each day or as told by your health care provider. ?Do exercises exactly as told by your health care provider and adjust them as directed. It is normal to feel mild stretching, pulling, tightness, or discomfort as you do these exercises, but you should stop right away if you feel sudden pain or your pain gets worse. ?Exercises ?Single knee to chest ?Repeat these steps 3-5 times for each leg: ?Lie on your back on a firm bed or the floor with your legs extended. ?Bring one knee to your chest. Your other leg should stay extended and in contact with the floor. ?Hold your knee in place by grabbing your knee or thigh with both hands and hold. ?Pull on your knee until you feel a gentle stretch in your lower back or buttocks. ?Hold the stretch for 10-30 seconds. ?Slowly release and straighten your leg. ? ?Pelvic tilt ?Repeat these steps 5-10 times: ?Lie on your back on a firm bed or the floor with your legs extended. ?Bend your knees so they are pointing toward the ceiling and your feet are flat on the floor. ?Tighten your lower abdominal muscles to press your lower back against the floor. This motion will tilt your pelvis so your tailbone points up toward the ceiling instead of pointing to your feet or the floor. ?With gentle tension and even breathing, hold this position for 5-10 seconds. ? ?Cat-cow ?Repeat these steps until your lower back becomes  more flexible: ?Get into a hands-and-knees position on a firm bed or the floor. Keep your hands under your shoulders, and keep your knees under your hips. You may place padding under your knees for comfort. ?Let your head hang down toward your chest. Contract your abdominal muscles and point your tailbone toward the floor so your lower back becomes rounded like the back of a cat. ?Hold this position for 5 seconds. ?Slowly lift your head, let your abdominal muscles relax, and point your tailbone up toward the ceiling so your back forms a sagging arch like the back of a cow. ?Hold this position for 5 seconds. ? ?Press-ups ?Repeat these steps 5-10 times: ?Lie on your abdomen (face-down) on a firm bed or the floor. ?Place your palms near your head, about shoulder-width apart. ?Keeping your back as relaxed as possible and keeping your hips on the floor, slowly straighten your arms to raise the top half of your body and lift your shoulders. Do not use your back muscles to raise your upper torso. You may adjust the placement of your hands to make yourself more comfortable. ?Hold this position for 5 seconds while you keep your back relaxed. ?Slowly return to lying flat on the floor. ? ?Bridges ?Repeat these steps 10 times: ?Lie on your back on a firm bed or the floor. ?Bend your knees so they are pointing toward the ceiling and your feet are flat on the floor. Your arms should be flat   at your sides, next to your body. ?Tighten your buttocks muscles and lift your buttocks off the floor until your waist is at almost the same height as your knees. You should feel the muscles working in your buttocks and the back of your thighs. If you do not feel these muscles, slide your feet 1-2 inches (2.5-5 cm) farther away from your buttocks. ?Hold this position for 3-5 seconds. ?Slowly lower your hips to the starting position, and allow your buttocks muscles to relax completely. ?If this exercise is too easy, try doing it with your arms  crossed over your chest. ?Abdominal crunches ?Repeat these steps 5-10 times: ?Lie on your back on a firm bed or the floor with your legs extended. ?Bend your knees so they are pointing toward the ceiling and your feet are flat on the floor. ?Cross your arms over your chest. ?Tip your chin slightly toward your chest without bending your neck. ?Tighten your abdominal muscles and slowly raise your torso high enough to lift your shoulder blades a tiny bit off the floor. Avoid raising your torso higher than that because it can put too much stress on your lower back and does not help to strengthen your abdominal muscles. ?Slowly return to your starting position. ? ?Back lifts ?Repeat these steps 5-10 times: ?Lie on your abdomen (face-down) with your arms at your sides, and rest your forehead on the floor. ?Tighten the muscles in your legs and your buttocks. ?Slowly lift your chest off the floor while you keep your hips pressed to the floor. Keep the back of your head in line with the curve in your back. Your eyes should be looking at the floor. ?Hold this position for 3-5 seconds. ?Slowly return to your starting position. ? ?Contact a health care provider if: ?Your back pain or discomfort gets much worse when you do an exercise. ?Your worsening back pain or discomfort does not lessen within 2 hours after you exercise. ?If you have any of these problems, stop doing these exercises right away. Do not do them again unless your health care provider says that you can. ?Get help right away if: ?You develop sudden, severe back pain. If this happens, stop doing the exercises right away. Do not do them again unless your health care provider says that you can. ?This information is not intended to replace advice given to you by your health care provider. Make sure you discuss any questions you have with your health care provider. ?Document Revised: 10/29/2020 Document Reviewed: 07/17/2020 ?Elsevier Patient Education ? 2023 Elsevier  Inc. ? ?

## 2022-06-08 NOTE — Progress Notes (Signed)
  [name], MD Phone: [phone]  Terry Cameron is a 77 y.o. male who presents today for same day visit.  Low back pain: Patient notes he was bending over picking up something yesterday and felt a catch in his right low back.  Yesterday the pain was 8/10 and today it is 4/10.  He notes no numbness, weakness, incontinence, or radiation.  He is been taking Tylenol and over-the-counter NSAIDs.  Social History   Tobacco Use  Smoking Status Former   Types: Cigarettes   Quit date: 1974   Years since quitting: 50.0  Smokeless Tobacco Never  Tobacco Comments   SMOKED FOR ABOUT 8 YEARS    Current Outpatient Medications on File Prior to Visit  Medication Sig Dispense Refill   atorvastatin (LIPITOR) 10 MG tablet Take 1 tablet (10 mg total) by mouth daily. 90 tablet 1   azelastine (ASTELIN) 0.1 % nasal spray Place 2 sprays into both nostrils 2 (two) times daily. Use in each nostril as directed 30 mL 12   meloxicam (MOBIC) 15 MG tablet Take 15 mg by mouth daily.     tamsulosin (FLOMAX) 0.4 MG CAPS capsule TAKE 1 CAPSULE BY MOUTH EVERY DAY 90 capsule 1   SUPREP BOWEL PREP KIT 17.5-3.13-1.6 GM/177ML SOLN Take by mouth. (Patient not taking: Reported on 04/02/2022)     No current facility-administered medications on file prior to visit.     ROS see history of present illness  Objective  Physical Exam Vitals:   06/08/22 1512  BP: 126/60  Pulse: 82  Temp: 97.7 F (36.5 C)  SpO2: 98%    BP Readings from Last 3 Encounters:  06/08/22 126/60  04/02/22 120/64  12/10/21 120/70   Wt Readings from Last 3 Encounters:  06/08/22 207 lb 3.2 oz (94 kg)  04/02/22 202 lb (91.6 kg)  12/10/21 197 lb 12.8 oz (89.7 kg)    Physical Exam Constitutional:      General: He is not in acute distress.    Appearance: He is not diaphoretic.  Cardiovascular:     Rate and Rhythm: Normal rate and regular rhythm.     Heart sounds: Normal heart sounds.  Pulmonary:     Effort: Pulmonary effort  is normal.     Breath sounds: Normal breath sounds.  Musculoskeletal:     Comments: No midline spine tenderness, no midline spine step-off, no muscular back tenderness  Skin:    General: Skin is warm and dry.  Neurological:     Mental Status: He is alert.     Comments: 5/5 strength bilateral quads, hamstrings, plantarflexion, and dorsiflexion, sensation to light touch intact bilateral lower extremities      Assessment/Plan: Please see individual problem list.  Acute right-sided low back pain without sciatica Assessment & Plan: Patient likely strained a muscle in his back.  Has been improving since yesterday.  He will continue to monitor and can use Tylenol or ibuprofen over-the-counter.  He will let me know if he has any worsening symptoms or new symptoms.  He will complete exercises at home once his back has recovered.     Return if symptoms worsen or fail to improve.   [name], MD [place]

## 2022-06-08 NOTE — Telephone Encounter (Signed)
I called the patient and he is scheduled to be seen today.  ng

## 2022-06-12 ENCOUNTER — Encounter: Payer: Self-pay | Admitting: Family Medicine

## 2022-06-12 ENCOUNTER — Ambulatory Visit (INDEPENDENT_AMBULATORY_CARE_PROVIDER_SITE_OTHER): Payer: Medicare HMO | Admitting: Family Medicine

## 2022-06-12 VITALS — BP 120/60 | HR 90 | Temp 98.7°F | Ht 70.0 in | Wt 207.8 lb

## 2022-06-12 DIAGNOSIS — G4733 Obstructive sleep apnea (adult) (pediatric): Secondary | ICD-10-CM

## 2022-06-12 DIAGNOSIS — M545 Low back pain, unspecified: Secondary | ICD-10-CM

## 2022-06-12 DIAGNOSIS — E782 Mixed hyperlipidemia: Secondary | ICD-10-CM

## 2022-06-12 DIAGNOSIS — M25552 Pain in left hip: Secondary | ICD-10-CM | POA: Diagnosis not present

## 2022-06-12 NOTE — Assessment & Plan Note (Signed)
Chronic issue.  Well-controlled.  He will continue his CPAP nightly.  He is benefiting from use of this.

## 2022-06-12 NOTE — Assessment & Plan Note (Signed)
Improved

## 2022-06-12 NOTE — Progress Notes (Signed)
[name], MD Phone: [phone]  Terry Cameron is a 77 y.o. male who presents today for f/u.  OSA CPAP use: nightly for 7+ hours Hypersomnia: no Well rested: yes CPAP company: [facility]  HYPERLIPIDEMIA Symptoms Chest pain on exertion:  no   Leg claudication:   no Medications: Compliance- taking lipitor Right upper quadrant pain- no  Muscle aches- no Lipid Panel     Component Value Date/Time   CHOL 153 08/29/2021 1059   TRIG 73.0 08/29/2021 1059   HDL 60.10 08/29/2021 1059   CHOLHDL 3 08/29/2021 1059   VLDL 14.6 08/29/2021 1059   LDLCALC 78 08/29/2021 1059   LDLDIRECT 86.0 02/05/2017 0922   Ports this.Left hip pain: This bothers him he sleeps on it or if he walks more than 3 miles.  Notes he did fall on this several years ago and it is bothering him a little since then.  He does not take any medication for this.  Back pain: Patient notes this has improved significantly since her visit earlier this week.   Social History   Tobacco Use  Smoking Status Former   Types: Cigarettes   Quit date: 1974   Years since quitting: 50.1  Smokeless Tobacco Never  Tobacco Comments   SMOKED FOR ABOUT 8 YEARS    Current Outpatient Medications on File Prior to Visit  Medication Sig Dispense Refill   atorvastatin (LIPITOR) 10 MG tablet Take 1 tablet (10 mg total) by mouth daily. 90 tablet 1   azelastine (ASTELIN) 0.1 % nasal spray Place 2 sprays into both nostrils 2 (two) times daily. Use in each nostril as directed 30 mL 12   meloxicam (MOBIC) 15 MG tablet Take 15 mg by mouth daily.     tamsulosin (FLOMAX) 0.4 MG CAPS capsule TAKE 1 CAPSULE BY MOUTH EVERY DAY 90 capsule 1   SUPREP BOWEL PREP KIT 17.5-3.13-1.6 GM/177ML SOLN Take by mouth. (Patient not taking: Reported on 06/12/2022)     No current facility-administered medications on file prior to visit.     ROS see history of present illness  Objective  Physical Exam Vitals:   06/12/22 1002  BP: 120/60   Pulse: 90  Temp: 98.7 F (37.1 C)  SpO2: 98%    BP Readings from Last 3 Encounters:  06/12/22 120/60  06/08/22 126/60  04/02/22 120/64   Wt Readings from Last 3 Encounters:  06/12/22 207 lb 12.8 oz (94.3 kg)  06/08/22 207 lb 3.2 oz (94 kg)  04/02/22 202 lb (91.6 kg)    Physical Exam Constitutional:      General: He is not in acute distress.    Appearance: He is not diaphoretic.  Cardiovascular:     Rate and Rhythm: Normal rate and regular rhythm.     Heart sounds: Normal heart sounds.  Pulmonary:     Effort: Pulmonary effort is normal.     Breath sounds: Normal breath sounds.  Musculoskeletal:     Comments: Left hip with no tenderness over the greater trochanter, good internal and external range of motion with no pain in the left hip  Skin:    General: Skin is warm and dry.  Neurological:     Mental Status: He is alert.      Assessment/Plan: Please see individual problem list.  Mixed hyperlipidemia Assessment & Plan: Chronic issue.  Patient will continue Lipitor 10 mg daily.  Check labs prior to physical in April.  Orders: -     Lipid panel; Future -  Comprehensive metabolic panel; Future  OSA (obstructive sleep apnea) Assessment & Plan: Chronic issue.  Well-controlled.  He will continue his CPAP nightly.  He is benefiting from use of this.   Left hip pain Assessment & Plan: Chronic issue.  Minimally bothersome.  Possibly related to arthritis.  He will monitor and if it worsens we can consider imaging.   Acute right-sided low back pain without sciatica Assessment & Plan: Improved.     Return in about 3 months (around 09/11/2022) for physical, labs 2 days prior.   [name], MD [place]

## 2022-06-12 NOTE — Assessment & Plan Note (Signed)
Chronic issue.  Patient will continue Lipitor 10 mg daily.  Check labs prior to physical in April.

## 2022-06-12 NOTE — Assessment & Plan Note (Signed)
Chronic issue.  Minimally bothersome.  Possibly related to arthritis.  He will monitor and if it worsens we can consider imaging.

## 2022-06-24 DIAGNOSIS — G4733 Obstructive sleep apnea (adult) (pediatric): Secondary | ICD-10-CM | POA: Diagnosis not present

## 2022-07-10 DIAGNOSIS — D225 Melanocytic nevi of trunk: Secondary | ICD-10-CM | POA: Diagnosis not present

## 2022-07-10 DIAGNOSIS — L570 Actinic keratosis: Secondary | ICD-10-CM | POA: Diagnosis not present

## 2022-07-10 DIAGNOSIS — D2262 Melanocytic nevi of left upper limb, including shoulder: Secondary | ICD-10-CM | POA: Diagnosis not present

## 2022-07-10 DIAGNOSIS — Z85828 Personal history of other malignant neoplasm of skin: Secondary | ICD-10-CM | POA: Diagnosis not present

## 2022-07-10 DIAGNOSIS — D2261 Melanocytic nevi of right upper limb, including shoulder: Secondary | ICD-10-CM | POA: Diagnosis not present

## 2022-07-23 DIAGNOSIS — G4733 Obstructive sleep apnea (adult) (pediatric): Secondary | ICD-10-CM | POA: Diagnosis not present

## 2022-08-08 ENCOUNTER — Other Ambulatory Visit: Payer: Self-pay | Admitting: Family Medicine

## 2022-08-08 DIAGNOSIS — E782 Mixed hyperlipidemia: Secondary | ICD-10-CM

## 2022-08-23 DIAGNOSIS — G4733 Obstructive sleep apnea (adult) (pediatric): Secondary | ICD-10-CM | POA: Diagnosis not present

## 2022-09-08 ENCOUNTER — Encounter: Payer: Self-pay | Admitting: Family Medicine

## 2022-09-08 ENCOUNTER — Other Ambulatory Visit: Payer: Self-pay

## 2022-09-08 MED ORDER — TAMSULOSIN HCL 0.4 MG PO CAPS: 0.4000 mg | ORAL_CAPSULE | Freq: Every day | ORAL | 1 refills | Status: DC

## 2022-09-08 NOTE — Telephone Encounter (Signed)
Prescription sen to Pharmacy

## 2022-09-22 DIAGNOSIS — G4733 Obstructive sleep apnea (adult) (pediatric): Secondary | ICD-10-CM | POA: Diagnosis not present

## 2022-10-23 DIAGNOSIS — G4733 Obstructive sleep apnea (adult) (pediatric): Secondary | ICD-10-CM | POA: Diagnosis not present

## 2022-10-29 ENCOUNTER — Telehealth: Payer: Self-pay | Admitting: Family Medicine

## 2022-10-29 NOTE — Telephone Encounter (Signed)
Copied from CRM [id number]. Topic: Medicare AWV >> Oct 29, 2022  1:44 PM Payton Doughty wrote: Reason for CRM: LM 10/29/2022 to schedule AWV   Verlee Rossetti; Care Guide Ambulatory Clinical Support [place] [facility] Direct Dial: [phone]

## 2022-11-08 ENCOUNTER — Other Ambulatory Visit: Payer: Self-pay | Admitting: Family Medicine

## 2022-11-08 DIAGNOSIS — E782 Mixed hyperlipidemia: Secondary | ICD-10-CM

## 2022-11-22 DIAGNOSIS — G4733 Obstructive sleep apnea (adult) (pediatric): Secondary | ICD-10-CM | POA: Diagnosis not present

## 2022-12-01 ENCOUNTER — Other Ambulatory Visit: Payer: Self-pay | Admitting: Family Medicine

## 2022-12-01 DIAGNOSIS — J309 Allergic rhinitis, unspecified: Secondary | ICD-10-CM

## 2022-12-02 ENCOUNTER — Telehealth: Payer: Self-pay

## 2022-12-02 NOTE — Telephone Encounter (Signed)
See phone note called pt to get him scheduled due to pcp note wanting him to follow up in April for a CPE and labs 2 days prior. No appts were made will send in once scheduled

## 2022-12-02 NOTE — Telephone Encounter (Signed)
Called pt to get him scheduled due to pcp last ov note he wanted him to follow up in 3 months for a CPE and labs 2 days prior to the CPE:    Return in about 3 months (around 09/11/2022) for physical, labs 2 days prior.     LVM on wifes phone to have him call back in get scheduled due to a refill request that was received

## 2022-12-03 NOTE — Telephone Encounter (Signed)
Pt was able to get scheduled refill has been sent.

## 2022-12-22 DIAGNOSIS — G4733 Obstructive sleep apnea (adult) (pediatric): Secondary | ICD-10-CM | POA: Diagnosis not present

## 2022-12-31 ENCOUNTER — Encounter (INDEPENDENT_AMBULATORY_CARE_PROVIDER_SITE_OTHER): Payer: Self-pay

## 2023-01-13 ENCOUNTER — Encounter: Payer: Medicare HMO | Admitting: Family Medicine

## 2023-01-27 ENCOUNTER — Telehealth: Payer: Self-pay | Admitting: Gastroenterology

## 2023-01-27 ENCOUNTER — Encounter: Payer: Self-pay | Admitting: Family Medicine

## 2023-01-27 ENCOUNTER — Ambulatory Visit (INDEPENDENT_AMBULATORY_CARE_PROVIDER_SITE_OTHER): Payer: Medicare HMO | Admitting: Family Medicine

## 2023-01-27 ENCOUNTER — Telehealth: Payer: Self-pay

## 2023-01-27 VITALS — BP 116/72 | HR 77 | Temp 98.4°F | Ht 70.0 in | Wt 205.6 lb

## 2023-01-27 DIAGNOSIS — E782 Mixed hyperlipidemia: Secondary | ICD-10-CM

## 2023-01-27 DIAGNOSIS — R21 Rash and other nonspecific skin eruption: Secondary | ICD-10-CM

## 2023-01-27 DIAGNOSIS — R7303 Prediabetes: Secondary | ICD-10-CM

## 2023-01-27 DIAGNOSIS — Z0001 Encounter for general adult medical examination with abnormal findings: Secondary | ICD-10-CM | POA: Diagnosis not present

## 2023-01-27 LAB — LIPID PANEL
Cholesterol: 151 mg/dL (ref 0–200)
HDL: 54.8 mg/dL (ref >39.00)
LDL Cholesterol: 73 mg/dL (ref 0–99)
NonHDL: 96.38
Total CHOL/HDL Ratio: 3
Triglycerides: 116 mg/dL (ref 0.0–149.0)
VLDL: 23.2 mg/dL (ref 0.0–40.0)

## 2023-01-27 LAB — COMPREHENSIVE METABOLIC PANEL
ALT: 24 U/L (ref 0–53)
AST: 26 U/L (ref 0–37)
Albumin: 4.1 g/dL (ref 3.5–5.2)
Alkaline Phosphatase: 64 U/L (ref 39–117)
BUN: 34 mg/dL — ABNORMAL HIGH (ref 6–23)
CO2: 26 meq/L (ref 19–32)
Calcium: 9.6 mg/dL (ref 8.4–10.5)
Chloride: 106 meq/L (ref 96–112)
Creatinine, Ser: 1.45 mg/dL (ref 0.40–1.50)
GFR: 47.08 mL/min — ABNORMAL LOW (ref >60.00)
Glucose, Bld: 100 mg/dL — ABNORMAL HIGH (ref 70–99)
Potassium: 4.8 meq/L (ref 3.5–5.1)
Sodium: 138 meq/L (ref 135–145)
Total Bilirubin: 0.7 mg/dL (ref 0.2–1.2)
Total Protein: 7.1 g/dL (ref 6.0–8.3)

## 2023-01-27 LAB — HEMOGLOBIN A1C: Hgb A1c MFr Bld: 5.7 % (ref 4.6–6.5)

## 2023-01-27 MED ORDER — TRIAMCINOLONE ACETONIDE 0.1 % EX CREA: 1.0000 | TOPICAL_CREAM | Freq: Two times a day (BID) | CUTANEOUS | 0 refills | Status: DC

## 2023-01-27 NOTE — Telephone Encounter (Signed)
The patient called in to schedule his colonoscopy. Per Dr. Tobi Bastos I recommend you have a repeat colonoscopy in 3 years to determine if you have developed any new polyps and to screen for colorectal cancer. The patient would like to have the procedure done before December.

## 2023-01-27 NOTE — Telephone Encounter (Signed)
Patient call returned to schedule his colonoscopy.  Informed him that his last colonoscopy was performed with Dr. Tobi Bastos 04/26/20 and Dr. Tobi Bastos recommended repeat in 3 years.  Explained to him that his colonoscopy cannot be performed earlier than 04/27/23 or insurance will not cover. He said he will be in [facility] and cannot schedule in December.  He will call back when he comes back from [facility].  Thanks,  [place], [place]

## 2023-01-27 NOTE — Progress Notes (Signed)
Marikay Alar, MD Phone: [phone]  Terry Cameron is a 77 y.o. male who presents today for CPE.  Diet: Reports he was on 2 cruises recently and notes he ate too much though generally eats very healthy at home, no soda or sweet tea, not much fried foods, not many sweets Exercise: Exercises 5 times a week with pickleball, golf, or the gym Colonoscopy: Due in December patient notes he already received a letter for this Prostate cancer screening: Aged out Family history-  Prostate cancer: no  Colon cancer: no Vaccines-   Flu: UTD  Tetanus: UTD  Shingles: UTD  COVID19: UTD  Pneumonia: UTD  RSV: UTD Hep C Screening: UTD Tobacco use: no Alcohol use: 6/week Illicit Drug use: no Dentist: yes Ophthalmology: yes  Rash: Patient notes rash on his left upper chest.  This has been present for a couple weeks.  He was in [place] when this started though it has not improved since returning home.  Notes it itches at times.  He has not really tried anything on this.   Active Ambulatory Problems    Diagnosis Date Noted   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 08/29/2015   Hyperlipidemia 01/01/2017   OSA (obstructive sleep apnea) 01/01/2017   Prediabetes 10/15/2017   Trigger little finger of left hand 06/03/2018   Erectile dysfunction 06/03/2018   Skin fragility 12/02/2018   Tinnitus 06/09/2019   Digital mucous cyst of left hand 07/14/2018   Trigger finger of left hand 07/14/2018   Abrasion of palm 08/27/2020   Osteoarthritis 08/27/2020   Encounter for general adult medical examination with abnormal findings 08/29/2021   Overweight 08/29/2021   Urine frequency 12/10/2021   Allergic rhinitis 12/10/2021   Bug bite 12/10/2021   Sore throat 04/02/2022   Low back pain 06/08/2022   Left hip pain 06/12/2022   Rash 01/27/2023   Resolved Ambulatory Problems    Diagnosis Date Noted   Low back pain 08/08/2015   Colon cancer screening 01/01/2017   Need for hepatitis C  screening test 01/01/2017   Epistaxis 02/12/2017   Chest tightness 06/12/2017   Tennis elbow 10/15/2017   Iron deficiency 12/02/2018   Past Medical History:  Diagnosis Date   Arthritis    Chicken pox    Emphysema of lung (HCC)    Sleep apnea     Family History  Problem Relation Age of Onset   Hypertension Father    Heart attack Father    Diabetes Maternal Grandmother    Glaucoma Maternal Grandmother    Arthritis Mother     Social History   Socioeconomic History   Marital status: Married    Spouse name: Not on file   Number of children: Not on file   Years of education: Not on file   Highest education level: Not on file  Occupational History   Not on file  Tobacco Use   Smoking status: Former    Current packs/day: 0.00    Types: Cigarettes    Quit date: 1974    Years since quitting: 50.7   Smokeless tobacco: Never   Tobacco comments:    SMOKED FOR ABOUT 8 YEARS  Vaping Use   Vaping status: Never Used  Substance and Sexual Activity   Alcohol use: Yes    Comment: COUPLE TIMES A WEEK    Drug use: No   Sexual activity: Not on file  Other Topics Concern   Not on file  Social History Narrative   Not on  file   Social Determinants of Health   Financial Resource Strain: Low Risk  (11/14/2021)   Overall Financial Resource Strain (CARDIA)    Difficulty of Paying Living Expenses: Not hard at all  Food Insecurity: No Food Insecurity (11/14/2021)   Hunger Vital Sign    Worried About Running Out of Food in the Last Year: Never true    Ran Out of Food in the Last Year: Never true  Transportation Needs: No Transportation Needs (11/14/2021)   PRAPARE - Administrator, Civil Service (Medical): No    Lack of Transportation (Non-Medical): No  Physical Activity: Insufficiently Active (11/14/2021)   Exercise Vital Sign    Days of Exercise per Week: 4 days    Minutes of Exercise per Session: 30 min  Stress: No Stress Concern Present (11/14/2021)   [employer] of Occupational Health - Occupational Stress Questionnaire    Feeling of Stress : Not at all  Social Connections: Unknown (11/14/2021)   Social Connection and Isolation Panel [NHANES]    Frequency of Communication with Friends and Family: Not on file    Frequency of Social Gatherings with Friends and Family: Not on file    Attends Religious Services: Not on file    Active Member of Clubs or Organizations: Not on file    Attends Banker Meetings: Not on file    Marital Status: Married  Intimate Partner Violence: Not At Risk (11/14/2021)   Humiliation, Afraid, Rape, and Kick questionnaire    Fear of Current or Ex-Partner: No    Emotionally Abused: No    Physically Abused: No    Sexually Abused: No    ROS  General:  Negative for nexplained weight loss, fever Skin: Negative for new or changing mole, sore that won't heal HEENT: Negative for trouble hearing, trouble seeing, ringing in ears, mouth sores, hoarseness, change in voice, dysphagia. CV:  Negative for chest pain, dyspnea, edema, palpitations Resp: Negative for cough, dyspnea, hemoptysis GI: Negative for nausea, vomiting, diarrhea, constipation, abdominal pain, melena, hematochezia. GU: Negative for dysuria, incontinence, urinary hesitance, hematuria, vaginal or penile discharge, polyuria, sexual difficulty, lumps in testicle or breasts MSK: Negative for muscle cramps or aches, joint pain or swelling Neuro: Negative for headaches, weakness, numbness, dizziness, passing out/fainting Psych: Negative for depression, anxiety, memory problems  Objective  Physical Exam Vitals:   01/27/23 0905  BP: 116/72  Pulse: 77  Temp: 98.4 F (36.9 C)  SpO2: 99%    BP Readings from Last 3 Encounters:  01/27/23 116/72  06/12/22 120/60  06/08/22 126/60   Wt Readings from Last 3 Encounters:  01/27/23 205 lb 9.6 oz (93.3 kg)  06/12/22 207 lb 12.8 oz (94.3 kg)  06/08/22 207 lb 3.2 oz (94 kg)    Physical  Exam Constitutional:      General: He is not in acute distress.    Appearance: He is not diaphoretic.  Cardiovascular:     Rate and Rhythm: Normal rate and regular rhythm.     Heart sounds: Normal heart sounds.  Pulmonary:     Effort: Pulmonary effort is normal.     Breath sounds: Normal breath sounds.  Abdominal:     General: Bowel sounds are normal. There is no distension.     Palpations: Abdomen is soft.     Tenderness: There is no abdominal tenderness.  Musculoskeletal:     Right lower leg: No edema.     Left lower leg: No edema.  Lymphadenopathy:  Cervical: No cervical adenopathy.  Skin:    General: Skin is warm and dry.  Neurological:     Mental Status: He is alert.  Psychiatric:        Mood and Affect: Mood normal.    Left upper chest  Assessment/Plan:   Encounter for general adult medical examination with abnormal findings Assessment & Plan: Physical exam completed.  Encouraged continued healthy diet and exercise.  He will call to schedule his colonoscopy.  His vaccines are up-to-date.  Lab work as outlined.   Mixed hyperlipidemia -     Comprehensive metabolic panel -     Lipid panel  Prediabetes -     Hemoglobin A1c  Rash Assessment & Plan: Recent onset issue.  Undetermined cause.  Will trial triamcinolone topically for up to 2 weeks.  If not improving with this he will let us know.  Orders: -     Triamcinolone Acetonide; Apply 1 Application topically 2 (two) times daily.  Dispense: 30 g; Refill: 0    Return in about 1 year (around 01/27/2024) for physical, 6 months OSA.  Patient reports an annual wellness visit scheduled for tomorrow and also has 1 through his insurance in November.  Discussed it would be okay for the patient to cancel the annual wellness visit through our office given that I am not sure that his insurance will cover both of them.  Marikay Alar, MD [facility]

## 2023-01-27 NOTE — Progress Notes (Signed)
Patient states he drinks 3-4 bottles of water a day but he will try to increase that a little. Patient denies any anti inflammatories or OTC supplements. Patient is agreeable to monitor his diet and stay active and to increase the water.

## 2023-01-27 NOTE — Assessment & Plan Note (Signed)
Physical exam completed.  Encouraged continued healthy diet and exercise.  He will call to schedule his colonoscopy.  His vaccines are up-to-date.  Lab work as outlined.

## 2023-01-27 NOTE — Assessment & Plan Note (Signed)
Recent onset issue.  Undetermined cause.  Will trial triamcinolone topically for up to 2 weeks.  If not improving with this he will let us know.

## 2023-01-29 ENCOUNTER — Other Ambulatory Visit: Payer: Self-pay | Admitting: Family Medicine

## 2023-01-29 DIAGNOSIS — N179 Acute kidney failure, unspecified: Secondary | ICD-10-CM

## 2023-02-24 ENCOUNTER — Other Ambulatory Visit (INDEPENDENT_AMBULATORY_CARE_PROVIDER_SITE_OTHER): Payer: Medicare HMO

## 2023-02-24 DIAGNOSIS — N179 Acute kidney failure, unspecified: Secondary | ICD-10-CM

## 2023-02-24 LAB — BASIC METABOLIC PANEL
BUN: 24 mg/dL — ABNORMAL HIGH (ref 6–23)
CO2: 27 meq/L (ref 19–32)
Calcium: 9.6 mg/dL (ref 8.4–10.5)
Chloride: 105 meq/L (ref 96–112)
Creatinine, Ser: 1.34 mg/dL (ref 0.40–1.50)
GFR: 51.73 mL/min — ABNORMAL LOW (ref >60.00)
Glucose, Bld: 119 mg/dL — ABNORMAL HIGH (ref 70–99)
Potassium: 4.2 meq/L (ref 3.5–5.1)
Sodium: 140 meq/L (ref 135–145)

## 2023-02-25 ENCOUNTER — Other Ambulatory Visit: Payer: Medicare HMO

## 2023-02-26 ENCOUNTER — Telehealth: Payer: Self-pay

## 2023-02-26 DIAGNOSIS — N179 Acute kidney failure, unspecified: Secondary | ICD-10-CM

## 2023-02-26 NOTE — Telephone Encounter (Signed)
Patient states he is returning our call.  I read Dr. Bernardo Heater message to patient.  Patient states he has been trying to drink six bottles of water per day.  Patient states he has not been taking anti-inflammatories.  Patient states he has not been having trouble urinating.

## 2023-02-26 NOTE — Telephone Encounter (Signed)
-----   Message from Marikay Alar sent at 02/26/2023 12:41 PM EDT ----- Please let the patient know that his kidney function has improved somewhat though still remains below his baseline.  How much water has he been drinking?  Is he having trouble urinating?  Any anti-inflammatory such as ibuprofen or Aleve?

## 2023-02-26 NOTE — Telephone Encounter (Signed)
 Left message to call the office back regarding the lab results below.

## 2023-02-28 ENCOUNTER — Other Ambulatory Visit: Payer: Self-pay | Admitting: Family Medicine

## 2023-02-28 DIAGNOSIS — J309 Allergic rhinitis, unspecified: Secondary | ICD-10-CM

## 2023-03-02 ENCOUNTER — Telehealth: Payer: Self-pay | Admitting: Gastroenterology

## 2023-03-02 ENCOUNTER — Other Ambulatory Visit: Payer: Self-pay | Admitting: Family Medicine

## 2023-03-02 ENCOUNTER — Telehealth: Payer: Self-pay

## 2023-03-02 NOTE — Addendum Note (Signed)
Addended by: Prince Solian A on: 03/02/2023 04:29 PM   Modules accepted: Orders

## 2023-03-02 NOTE — Telephone Encounter (Signed)
Left message to call back and schedule a BMET lab in 3 weeks.

## 2023-03-02 NOTE — Telephone Encounter (Signed)
Informed him that his last colonoscopy was performed with Dr. Tobi Bastos 04/26/20 and Dr. Tobi Bastos recommended repeat in 3 years.  Explained to him that his colonoscopy cannot be performed earlier than 04/27/23 or insurance will not cover.  He said he will be in [facility] and cannot schedule in December.  He will call back when he comes back from [facility] which will be in the spring.  Thanks,  Marcelino Duster CMA

## 2023-03-02 NOTE — Telephone Encounter (Signed)
Patient called in to schedule his colonoscopy.

## 2023-03-02 NOTE — Telephone Encounter (Signed)
Noted. One last question, is he taking meloxicam? It is on his medication list. If he is, then he should stop it. If he is not taking meloxicam then I would recommend checking a renal US to look at the structure of his kidneys to make sure there is not a structural cause of his kidney function change.

## 2023-03-02 NOTE — Telephone Encounter (Signed)
Left message to call the office back regarding Dr. Purvis Sheffield question and recommendations below.

## 2023-03-02 NOTE — Telephone Encounter (Signed)
Patient confirmed he is taking Meloxicam and I said to stop the Meloxicam. Patient understands and is agreeable.

## 2023-03-02 NOTE — Telephone Encounter (Signed)
Patient called in to schedule his colonoscopy. He left a voicemail and I called him back.

## 2023-03-03 NOTE — Telephone Encounter (Signed)
Left message to call the office and schedule a BMET lab in 3 weeks.

## 2023-03-04 ENCOUNTER — Other Ambulatory Visit: Payer: Medicare HMO

## 2023-03-04 NOTE — Telephone Encounter (Signed)
Left message to call the office and schedule a BMET lab in 3 weeks.

## 2023-03-04 NOTE — Telephone Encounter (Signed)
Pt is already scheduled

## 2023-03-22 DIAGNOSIS — G4733 Obstructive sleep apnea (adult) (pediatric): Secondary | ICD-10-CM | POA: Diagnosis not present

## 2023-03-23 ENCOUNTER — Other Ambulatory Visit (INDEPENDENT_AMBULATORY_CARE_PROVIDER_SITE_OTHER): Payer: Medicare HMO

## 2023-03-23 DIAGNOSIS — N179 Acute kidney failure, unspecified: Secondary | ICD-10-CM

## 2023-03-24 LAB — BASIC METABOLIC PANEL
BUN: 22 mg/dL (ref 6–23)
CO2: 27 meq/L (ref 19–32)
Calcium: 9.8 mg/dL (ref 8.4–10.5)
Chloride: 104 meq/L (ref 96–112)
Creatinine, Ser: 1.44 mg/dL (ref 0.40–1.50)
GFR: 47.42 mL/min — ABNORMAL LOW (ref >60.00)
Glucose, Bld: 104 mg/dL — ABNORMAL HIGH (ref 70–99)
Potassium: 4.2 meq/L (ref 3.5–5.1)
Sodium: 139 meq/L (ref 135–145)

## 2023-03-29 ENCOUNTER — Telehealth: Payer: Self-pay

## 2023-03-29 NOTE — Telephone Encounter (Signed)
Left message to call the office back regarding lab results below. 

## 2023-03-29 NOTE — Telephone Encounter (Signed)
-----   Message from Marikay Alar sent at 03/29/2023  2:47 PM EST ----- Please let the patient know his kidney function remains reduced.  Has he been taking any ibuprofen or Aleve?  Has he been taking any supplements?  How much water has he been drinking?  At this point I would suggest doing an ultrasound of his kidneys to look at them structurally.  I would also check his urine for protein.  I can place orders for these things when she speak with him.

## 2023-03-30 ENCOUNTER — Other Ambulatory Visit: Payer: Self-pay

## 2023-03-30 ENCOUNTER — Other Ambulatory Visit (INDEPENDENT_AMBULATORY_CARE_PROVIDER_SITE_OTHER): Payer: Medicare HMO

## 2023-03-30 DIAGNOSIS — R809 Proteinuria, unspecified: Secondary | ICD-10-CM

## 2023-03-30 LAB — URINALYSIS, ROUTINE W REFLEX MICROSCOPIC
Bilirubin Urine: NEGATIVE
Hgb urine dipstick: NEGATIVE
Ketones, ur: NEGATIVE
Leukocytes,Ua: NEGATIVE
Nitrite: NEGATIVE
RBC / HPF: NONE SEEN (ref 0–?)
Specific Gravity, Urine: <=1.005 — AB (ref 1.000–1.030)
Total Protein, Urine: NEGATIVE
Urine Glucose: NEGATIVE
Urobilinogen, UA: 0.2 (ref 0.0–1.0)
WBC, UA: NONE SEEN (ref 0–?)
pH: 6 (ref 5.0–8.0)

## 2023-03-30 NOTE — Addendum Note (Signed)
Addended by: Prince Solian A on: 03/30/2023 11:34 AM   Modules accepted: Orders

## 2023-03-30 NOTE — Addendum Note (Signed)
Addended by: Warden Fillers on: 03/30/2023 02:48 PM   Modules accepted: Orders

## 2023-03-30 NOTE — Telephone Encounter (Signed)
Patient states he is returning our call.  I read Dr. Bernardo Heater message to patient.  Patient states he has not been taking ibuprofen or Aleve.  Patient states he takes a bunch of vitamins, but he has been taking them for years.  Patient states he has been drinking less water, as he has been traveling a lot.  Patient states his sugar has also been up.  Patient states he likes figs and they have a fig tree in the back yard.  Patient states he is agreeable to having an ultrasound and lab visit as Dr. Birdie Sons suggested.  I scheduled an appointment for patient to have a lab visit today.

## 2023-03-30 NOTE — Telephone Encounter (Signed)
Order put in.

## 2023-03-31 ENCOUNTER — Telehealth: Payer: Self-pay | Admitting: Family Medicine

## 2023-03-31 ENCOUNTER — Other Ambulatory Visit: Payer: Self-pay | Admitting: Family Medicine

## 2023-03-31 DIAGNOSIS — R944 Abnormal results of kidney function studies: Secondary | ICD-10-CM

## 2023-03-31 NOTE — Telephone Encounter (Signed)
Lft pt vm to call ofc to sch CT. thanks 

## 2023-04-01 ENCOUNTER — Telehealth: Payer: Self-pay | Admitting: Family Medicine

## 2023-04-01 ENCOUNTER — Encounter: Payer: Self-pay | Admitting: Family Medicine

## 2023-04-01 DIAGNOSIS — H524 Presbyopia: Secondary | ICD-10-CM | POA: Diagnosis not present

## 2023-04-01 NOTE — Telephone Encounter (Signed)
Lft pt vm to call ofc to sch US. thanks ?

## 2023-04-06 NOTE — Telephone Encounter (Signed)
Noted.  Please see his other message.  We will see what his ultrasound reveals.

## 2023-04-12 ENCOUNTER — Ambulatory Visit
Admission: RE | Admit: 2023-04-12 | Discharge: 2023-04-12 | Disposition: A | Discharge status: Home / Self Care | Payer: Medicare HMO | Source: Ambulatory Visit | Attending: Family Medicine | Admitting: Family Medicine

## 2023-04-12 DIAGNOSIS — R944 Abnormal results of kidney function studies: Secondary | ICD-10-CM | POA: Diagnosis not present

## 2023-04-12 DIAGNOSIS — N281 Cyst of kidney, acquired: Secondary | ICD-10-CM | POA: Diagnosis not present

## 2023-04-19 ENCOUNTER — Telehealth: Payer: Self-pay

## 2023-04-19 ENCOUNTER — Other Ambulatory Visit: Payer: Self-pay | Admitting: Family Medicine

## 2023-04-19 ENCOUNTER — Encounter: Payer: Self-pay | Admitting: Family Medicine

## 2023-04-19 DIAGNOSIS — L578 Other skin changes due to chronic exposure to nonionizing radiation: Secondary | ICD-10-CM | POA: Diagnosis not present

## 2023-04-19 DIAGNOSIS — R944 Abnormal results of kidney function studies: Secondary | ICD-10-CM

## 2023-04-19 DIAGNOSIS — C44519 Basal cell carcinoma of skin of other part of trunk: Secondary | ICD-10-CM | POA: Diagnosis not present

## 2023-04-19 DIAGNOSIS — D2262 Melanocytic nevi of left upper limb, including shoulder: Secondary | ICD-10-CM | POA: Diagnosis not present

## 2023-04-19 DIAGNOSIS — D2272 Melanocytic nevi of left lower limb, including hip: Secondary | ICD-10-CM | POA: Diagnosis not present

## 2023-04-19 DIAGNOSIS — L814 Other melanin hyperpigmentation: Secondary | ICD-10-CM | POA: Diagnosis not present

## 2023-04-19 DIAGNOSIS — D2271 Melanocytic nevi of right lower limb, including hip: Secondary | ICD-10-CM | POA: Diagnosis not present

## 2023-04-19 DIAGNOSIS — L821 Other seborrheic keratosis: Secondary | ICD-10-CM | POA: Diagnosis not present

## 2023-04-19 DIAGNOSIS — D2261 Melanocytic nevi of right upper limb, including shoulder: Secondary | ICD-10-CM | POA: Diagnosis not present

## 2023-04-19 DIAGNOSIS — L570 Actinic keratosis: Secondary | ICD-10-CM | POA: Diagnosis not present

## 2023-04-19 DIAGNOSIS — D225 Melanocytic nevi of trunk: Secondary | ICD-10-CM | POA: Diagnosis not present

## 2023-04-19 DIAGNOSIS — D485 Neoplasm of uncertain behavior of skin: Secondary | ICD-10-CM | POA: Diagnosis not present

## 2023-04-19 NOTE — Telephone Encounter (Signed)
Pt requesting call back to schedule colonoscopy.

## 2023-04-19 NOTE — Telephone Encounter (Signed)
Spoke to pt. Told pt, Dr. Purvis Sheffield response regarding ultrasound. Pt had no questions/concerns. Pt stated he doesn't have any trouble when urinating. Pt just mentioned he has a frequency to urinate & feels cold. Pt does want to be referred to nephrologist. Call back # [phone].

## 2023-04-19 NOTE — Telephone Encounter (Signed)
Referral placed.

## 2023-04-19 NOTE — Telephone Encounter (Signed)
Left message to call the office back regarding his results.

## 2023-04-19 NOTE — Telephone Encounter (Signed)
-----   Message from Marikay Alar sent at 04/19/2023 11:39 AM EST ----- Please let the patient know that his kidney ultrasound revealed findings consistent with chronic medical renal disease.  Typically this is related to chronic blood pressure issues or diabetes though the patient does not have these issues.  At the moment I do not have a reason for his reduced kidney function.  Given the persistent low GFR on prior lab work and this finding on ultrasound I would suggest having him see a nephrologist so they can help [place] figure out why he is having this reduced kidney function.  I this referral once you speak to him.  The ultrasound did reveal an enlarged prostate.  Is he having any trouble urinating?

## 2023-04-19 NOTE — Telephone Encounter (Signed)
Please see the result note and contact the patient.

## 2023-04-19 NOTE — Addendum Note (Signed)
Addended by: Glori Luis on: 04/19/2023 02:47 PM   Modules accepted: Orders

## 2023-04-20 NOTE — Telephone Encounter (Signed)
Please see results note.

## 2023-04-20 NOTE — Telephone Encounter (Signed)
Call returned to patient this morning to schedule his colonoscopy.  LVM for pt to return my call.   His colonoscopy is due after 04/27/23 with Dr. Tobi Bastos.  Thanks,  [place], [place]

## 2023-04-21 DIAGNOSIS — G4733 Obstructive sleep apnea (adult) (pediatric): Secondary | ICD-10-CM | POA: Diagnosis not present

## 2023-04-28 ENCOUNTER — Telehealth: Payer: Self-pay

## 2023-04-28 DIAGNOSIS — Z860100 Personal history of colon polyps, unspecified: Secondary | ICD-10-CM

## 2023-04-28 NOTE — Telephone Encounter (Signed)
Patient called in left a voicemail requesting to reschedule his procedure. I called the patient back to inform her we receive her message, and I sent the message to the scheduler.

## 2023-04-29 ENCOUNTER — Other Ambulatory Visit: Payer: Self-pay

## 2023-04-29 DIAGNOSIS — J301 Allergic rhinitis due to pollen: Secondary | ICD-10-CM | POA: Diagnosis not present

## 2023-04-29 DIAGNOSIS — Z791 Long term (current) use of non-steroidal anti-inflammatories (NSAID): Secondary | ICD-10-CM | POA: Diagnosis not present

## 2023-04-29 DIAGNOSIS — R7303 Prediabetes: Secondary | ICD-10-CM | POA: Diagnosis not present

## 2023-04-29 DIAGNOSIS — Z8249 Family history of ischemic heart disease and other diseases of the circulatory system: Secondary | ICD-10-CM | POA: Diagnosis not present

## 2023-04-29 DIAGNOSIS — N400 Enlarged prostate without lower urinary tract symptoms: Secondary | ICD-10-CM | POA: Diagnosis not present

## 2023-04-29 DIAGNOSIS — Z860100 Personal history of colon polyps, unspecified: Secondary | ICD-10-CM

## 2023-04-29 DIAGNOSIS — L309 Dermatitis, unspecified: Secondary | ICD-10-CM | POA: Diagnosis not present

## 2023-04-29 DIAGNOSIS — M1990 Unspecified osteoarthritis, unspecified site: Secondary | ICD-10-CM | POA: Diagnosis not present

## 2023-04-29 DIAGNOSIS — E785 Hyperlipidemia, unspecified: Secondary | ICD-10-CM | POA: Diagnosis not present

## 2023-04-29 DIAGNOSIS — G4733 Obstructive sleep apnea (adult) (pediatric): Secondary | ICD-10-CM | POA: Diagnosis not present

## 2023-04-29 DIAGNOSIS — H269 Unspecified cataract: Secondary | ICD-10-CM | POA: Diagnosis not present

## 2023-04-29 MED ORDER — NA SULFATE-K SULFATE-MG SULF 17.5-3.13-1.6 GM/177ML PO SOLN: 1.0000 | Freq: Once | ORAL | 0 refills | Status: AC

## 2023-04-29 NOTE — Telephone Encounter (Signed)
Gastroenterology Pre-Procedure Review  Request Date: 05/06/23 Requesting Physician: Dr. Allegra Lai  PATIENT REVIEW QUESTIONS: The patient responded to the following health history questions as indicated:    1. Are you having any GI issues? no 2. Do you have a personal history of Polyps? yes (last colonoscopy performed by Dr. Tobi Bastos 04/26/20 recommended repeat in 3 years.  Dr. Johnney Killian schedule is full and patient needed colonoscopy this month due to travel plans he has agreed to be scheduled with Dr. Allegra Lai) 3. Do you have a family history of Colon Cancer or Polyps? no 4. Diabetes Mellitus? no 5. Joint replacements in the past 12 months?no 6. Major health problems in the past 3 months?no 7. Any artificial heart valves, MVP, or defibrillator?no    MEDICATIONS & ALLERGIES:    Patient reports the following regarding taking any anticoagulation/antiplatelet therapy:   Plavix, Coumadin, Eliquis, Xarelto, Lovenox, Pradaxa, Brilinta, or Effient? no Aspirin? no  Patient confirms/reports the following medications:  Current Outpatient Medications  Medication Sig Dispense Refill   atorvastatin (LIPITOR) 10 MG tablet TAKE 1 TABLET BY MOUTH EVERY DAY 90 tablet 1   Azelastine HCl 137 MCG/SPRAY SOLN PLACE 2 SPRAYS INTO BOTH NOSTRILS 2 (TWO) TIMES DAILY. USE IN EACH NOSTRIL AS DIRECTED 90 mL 1   tamsulosin (FLOMAX) 0.4 MG CAPS capsule TAKE 1 CAPSULE BY MOUTH EVERY DAY 90 capsule 1   No current facility-administered medications for this visit.    Patient confirms/reports the following allergies:  No Known Allergies  No orders of the defined types were placed in this encounter.   AUTHORIZATION INFORMATION Primary Insurance: 1D#: Group #:  Secondary Insurance: 1D#: Group #:  SCHEDULE INFORMATION: Date: 05/06/23 Time: Location: [facility]

## 2023-04-29 NOTE — Addendum Note (Signed)
Addended by: Avie Arenas on: 04/29/2023 08:39 AM   Modules accepted: Orders

## 2023-05-03 ENCOUNTER — Telehealth: Payer: Self-pay

## 2023-05-03 DIAGNOSIS — N1831 Chronic kidney disease, stage 3a: Secondary | ICD-10-CM | POA: Diagnosis not present

## 2023-05-03 DIAGNOSIS — C44519 Basal cell carcinoma of skin of other part of trunk: Secondary | ICD-10-CM | POA: Diagnosis not present

## 2023-05-03 NOTE — Telephone Encounter (Signed)
Patient left a message on my voicemail at 11:48pm and states he has a colonoscopy on Thursday and has some questions.

## 2023-05-03 NOTE — Telephone Encounter (Signed)
Patient has been advised that he can use his dermatological cream for his face prior to colonoscopy if he needs it.  Thanks,  [place], [place]

## 2023-05-05 ENCOUNTER — Encounter: Payer: Self-pay | Admitting: Gastroenterology

## 2023-05-05 ENCOUNTER — Telehealth: Payer: Self-pay

## 2023-05-05 NOTE — Telephone Encounter (Signed)
Originally as my triage notes patient was scheduled with Dr. Allegra Lai due to patients travel plans and Dr. Tobi Bastos was full.  Trish contacted patient to provide arrival time for procedure with Dr. Tobi Bastos and the patient did not recall our conversation about if he really needed to have his colonoscopy by a certain date Dr. Allegra Lai was available because Dr. Tobi Bastos was already full.  I called Trish to let her know that if Dr Tobi Bastos has availability for tomorrow he can be moved to Dr. Johnney Killian schedule since he did the last colonoscopy.  Thanks, [place], [place]

## 2023-05-06 ENCOUNTER — Encounter
Admission: RE | Disposition: A | Discharge status: Home / Self Care | Payer: Self-pay | Source: Home / Self Care | Attending: Gastroenterology

## 2023-05-06 ENCOUNTER — Ambulatory Visit: Payer: Medicare HMO | Admitting: Anesthesiology

## 2023-05-06 ENCOUNTER — Encounter: Payer: Self-pay | Admitting: Gastroenterology

## 2023-05-06 ENCOUNTER — Ambulatory Visit
Admission: RE | Admit: 2023-05-06 | Discharge: 2023-05-06 | Disposition: A | Discharge status: Home / Self Care | Payer: Medicare HMO | Attending: Gastroenterology | Admitting: Gastroenterology

## 2023-05-06 DIAGNOSIS — Z860100 Personal history of colon polyps, unspecified: Secondary | ICD-10-CM

## 2023-05-06 DIAGNOSIS — Z1211 Encounter for screening for malignant neoplasm of colon: Secondary | ICD-10-CM | POA: Diagnosis not present

## 2023-05-06 DIAGNOSIS — D124 Benign neoplasm of descending colon: Secondary | ICD-10-CM | POA: Diagnosis not present

## 2023-05-06 DIAGNOSIS — Z09 Encounter for follow-up examination after completed treatment for conditions other than malignant neoplasm: Secondary | ICD-10-CM | POA: Diagnosis present

## 2023-05-06 DIAGNOSIS — K5730 Diverticulosis of large intestine without perforation or abscess without bleeding: Secondary | ICD-10-CM | POA: Diagnosis not present

## 2023-05-06 DIAGNOSIS — D369 Benign neoplasm, unspecified site: Secondary | ICD-10-CM

## 2023-05-06 DIAGNOSIS — K635 Polyp of colon: Secondary | ICD-10-CM | POA: Diagnosis not present

## 2023-05-06 DIAGNOSIS — J439 Emphysema, unspecified: Secondary | ICD-10-CM | POA: Diagnosis not present

## 2023-05-06 DIAGNOSIS — Z87891 Personal history of nicotine dependence: Secondary | ICD-10-CM | POA: Insufficient documentation

## 2023-05-06 DIAGNOSIS — D123 Benign neoplasm of transverse colon: Secondary | ICD-10-CM | POA: Diagnosis not present

## 2023-05-06 DIAGNOSIS — E785 Hyperlipidemia, unspecified: Secondary | ICD-10-CM | POA: Diagnosis not present

## 2023-05-06 DIAGNOSIS — D122 Benign neoplasm of ascending colon: Secondary | ICD-10-CM | POA: Diagnosis not present

## 2023-05-06 DIAGNOSIS — G4730 Sleep apnea, unspecified: Secondary | ICD-10-CM | POA: Diagnosis not present

## 2023-05-06 HISTORY — DX: Anemia, unspecified: D64.9

## 2023-05-06 HISTORY — PX: COLONOSCOPY WITH PROPOFOL: SHX5780

## 2023-05-06 SURGERY — COLONOSCOPY WITH PROPOFOL
Anesthesia: General

## 2023-05-06 MED ORDER — SODIUM CHLORIDE 0.9 % IV SOLN: INTRAVENOUS | Status: DC

## 2023-05-06 MED ORDER — LIDOCAINE HCL (CARDIAC) PF 100 MG/5ML IV SOSY
PREFILLED_SYRINGE | INTRAVENOUS | Status: DC | PRN
  Administered 2023-05-06: 100 mg via INTRAVENOUS

## 2023-05-06 MED ORDER — PHENYLEPHRINE 80 MCG/ML (10ML) SYRINGE FOR IV PUSH (FOR BLOOD PRESSURE SUPPORT)
PREFILLED_SYRINGE | INTRAVENOUS | Status: AC
  Filled 2023-05-06: qty 10

## 2023-05-06 MED ORDER — PROPOFOL 10 MG/ML IV BOLUS
INTRAVENOUS | Status: DC | PRN
  Administered 2023-05-06: 60 mg via INTRAVENOUS

## 2023-05-06 MED ORDER — PHENYLEPHRINE 80 MCG/ML (10ML) SYRINGE FOR IV PUSH (FOR BLOOD PRESSURE SUPPORT)
PREFILLED_SYRINGE | INTRAVENOUS | Status: DC | PRN
  Administered 2023-05-06 (×2): 80 ug via INTRAVENOUS

## 2023-05-06 MED ORDER — PROPOFOL 500 MG/50ML IV EMUL
INTRAVENOUS | Status: DC | PRN
  Administered 2023-05-06: 130 ug/kg/min via INTRAVENOUS

## 2023-05-06 NOTE — Transfer of Care (Signed)
Immediate Anesthesia Transfer of Care Note  Patient: Terry Cameron  Procedure(s) Performed: COLONOSCOPY WITH PROPOFOL  Patient Location: Endoscopy Unit  Anesthesia Type:General  Level of Consciousness: awake, alert , and drowsy  Airway & Oxygen Therapy: Patient Spontanous Breathing  Post-op Assessment: Report given to RN and Post -op Vital signs reviewed and stable  Post vital signs: Reviewed and stable  Last Vitals:  Vitals Value Taken Time  BP 101/51 05/06/23 1558  Temp 35.8 1558  Pulse 59 05/06/23 1559  Resp 14 05/06/23 1559  SpO2 100 % 05/06/23 1559  Vitals shown include unfiled device data.  Last Pain:  Vitals:   05/06/23 1557  TempSrc:   PainSc: 0-No pain         Complications: No notable events documented.

## 2023-05-06 NOTE — Anesthesia Postprocedure Evaluation (Signed)
Anesthesia Post Note  Patient: Terry Cameron  Procedure(s) Performed: COLONOSCOPY WITH PROPOFOL  Patient location during evaluation: Endoscopy Anesthesia Type: General Level of consciousness: awake and alert Pain management: pain level controlled Vital Signs Assessment: post-procedure vital signs reviewed and stable Respiratory status: spontaneous breathing, nonlabored ventilation, respiratory function stable and patient connected to nasal cannula oxygen Cardiovascular status: blood pressure returned to baseline and stable Postop Assessment: no apparent nausea or vomiting Anesthetic complications: no   No notable events documented.   Last Vitals:  Vitals:   05/06/23 1607 05/06/23 1617  BP: 115/68 137/70  Pulse: 67 66  Resp: 17 18  Temp:    SpO2: 100% 100%    Last Pain:  Vitals:   05/06/23 1617  TempSrc:   PainSc: 0-No pain                 Lenard Simmer

## 2023-05-06 NOTE — H&P (Signed)
Arlyss Repress, MD [address]  [address]  [place], [place] [address]  Main: [phone]  Fax: [phone] Pager: [phone]  Primary Care Physician:  Glori Luis, MD Primary Gastroenterologist:  Dr. Arlyss Repress  Pre-Procedure History & Physical: HPI:  Terry Cameron is a 77 y.o. male is here for an colonoscopy.   Past Medical History:  Diagnosis Date   Anemia    Arthritis    Chicken pox    Emphysema of lung (HCC)    Hyperlipidemia    Sleep apnea     Past Surgical History:  Procedure Laterality Date   BASAL CELL CARCINOMA EXCISION     COLONOSCOPY WITH PROPOFOL N/A 02/22/2017   Procedure: COLONOSCOPY WITH PROPOFOL;  Surgeon: Wyline Mood, MD;  Location: [facility] ENDOSCOPY;  Service: Gastroenterology;  Laterality: N/A;   COLONOSCOPY WITH PROPOFOL N/A 04/26/2020   Procedure: COLONOSCOPY WITH PROPOFOL;  Surgeon: Wyline Mood, MD;  Location: [facility] ENDOSCOPY;  Service: Gastroenterology;  Laterality: N/A;    Prior to Admission medications   Medication Sig Start Date End Date Taking? Authorizing Provider  atorvastatin (LIPITOR) 10 MG tablet TAKE 1 TABLET BY MOUTH EVERY DAY 11/09/22  Yes Glori Luis, MD  Azelastine HCl 137 MCG/SPRAY SOLN PLACE 2 SPRAYS INTO BOTH NOSTRILS 2 (TWO) TIMES DAILY. USE IN EACH NOSTRIL AS DIRECTED 03/01/23  Yes Glori Luis, MD  tamsulosin (FLOMAX) 0.4 MG CAPS capsule TAKE 1 CAPSULE BY MOUTH EVERY DAY 03/02/23  Yes Glori Luis, MD    Allergies as of 04/29/2023   (No Known Allergies)    Family History  Problem Relation Age of Onset   Hypertension Father    Heart attack Father    Diabetes Maternal Grandmother    Glaucoma Maternal Grandmother    Arthritis Mother     Social History   Socioeconomic History   Marital status: Married    Spouse name: Not on file   Number of children: Not on file   Years of education: Not on file   Highest education level: Not on file  Occupational History   Not on file  Tobacco  Use   Smoking status: Former    Current packs/day: 0.00    Types: Cigarettes    Quit date: 1974    Years since quitting: 51.0   Smokeless tobacco: Never   Tobacco comments:    SMOKED FOR ABOUT 8 YEARS  Vaping Use   Vaping status: Never Used  Substance and Sexual Activity   Alcohol use: Yes    Comment: COUPLE TIMES A WEEK    Drug use: No   Sexual activity: Not on file  Other Topics Concern   Not on file  Social History Narrative   Not on file   Social Drivers of Health   Financial Resource Strain: Low Risk  (11/14/2021)   Overall Financial Resource Strain (CARDIA)    Difficulty of Paying Living Expenses: Not hard at all  Food Insecurity: No Food Insecurity (11/14/2021)   Hunger Vital Sign    Worried About Running Out of Food in the Last Year: Never true    Ran Out of Food in the Last Year: Never true  Transportation Needs: No Transportation Needs (11/14/2021)   PRAPARE - Administrator, Civil Service (Medical): No    Lack of Transportation (Non-Medical): No  Physical Activity: Insufficiently Active (11/14/2021)   Exercise Vital Sign    Days of Exercise per Week: 4 days    Minutes of Exercise  per Session: 30 min  Stress: No Stress Concern Present (11/14/2021)   [employer] of Occupational Health - Occupational Stress Questionnaire    Feeling of Stress : Not at all  Social Connections: Unknown (11/14/2021)   Social Connection and Isolation Panel [NHANES]    Frequency of Communication with Friends and Family: Not on file    Frequency of Social Gatherings with Friends and Family: Not on file    Attends Religious Services: Not on file    Active Member of Clubs or Organizations: Not on file    Attends Banker Meetings: Not on file    Marital Status: Married  Intimate Partner Violence: Not At Risk (11/14/2021)   Humiliation, Afraid, Rape, and Kick questionnaire    Fear of Current or Ex-Partner: No    Emotionally Abused: No    Physically Abused: No     Sexually Abused: No    Review of Systems: See HPI, otherwise negative ROS  Physical Exam: BP (!) 140/70   Pulse 68   Temp (!) 96.5 F (35.8 C) (Temporal)   Resp 18   Ht 5\' 10"  (1.778 m)   Wt 88.6 kg   SpO2 100%   BMI 28.04 kg/m  General:   Alert,  pleasant and cooperative in NAD Head:  Normocephalic and atraumatic. Neck:  Supple; no masses or thyromegaly. Lungs:  Clear throughout to auscultation.    Heart:  Regular rate and rhythm. Abdomen:  Soft, nontender and nondistended. Normal bowel sounds, without guarding, and without rebound.   Neurologic:  Alert and  oriented x4;  grossly normal neurologically.  Impression/Plan: Terry Cameron is here for an colonoscopy to be performed for h/o colon adenomas  Risks, benefits, limitations, and alternatives regarding  colonoscopy have been reviewed with the patient.  Questions have been answered.  All parties agreeable.   Lannette Donath, MD  05/06/2023, 2:38 PM

## 2023-05-06 NOTE — Anesthesia Preprocedure Evaluation (Signed)
Anesthesia Evaluation  Patient identified by MRN, date of birth, ID band Patient awake    Reviewed: Allergy & Precautions, NPO status , Patient's Chart, lab work & pertinent test results  History of Anesthesia Complications Negative for: history of anesthetic complications  Airway Mallampati: III  TM Distance: <3 FB Neck ROM: full    Dental  (+) Chipped   Pulmonary shortness of breath and with exertion, sleep apnea , COPD, former smoker   Pulmonary exam normal        Cardiovascular (-) angina negative cardio ROS Normal cardiovascular exam     Neuro/Psych negative neurological ROS  negative psych ROS   GI/Hepatic negative GI ROS, Neg liver ROS,neg GERD  ,,  Endo/Other  negative endocrine ROS    Renal/GU negative Renal ROS  negative genitourinary   Musculoskeletal   Abdominal   Peds  Hematology negative hematology ROS (+)   Anesthesia Other Findings Past Medical History: No date: Anemia No date: Arthritis No date: Chicken pox No date: Emphysema of lung (HCC) No date: Hyperlipidemia No date: Sleep apnea  Past Surgical History: No date: BASAL CELL CARCINOMA EXCISION 02/22/2017: COLONOSCOPY WITH PROPOFOL; N/A     Comment:  Procedure: COLONOSCOPY WITH PROPOFOL;  Surgeon: Wyline Mood, MD;  Location: [facility] ENDOSCOPY;  Service:               Gastroenterology;  Laterality: N/A; 04/26/2020: COLONOSCOPY WITH PROPOFOL; N/A     Comment:  Procedure: COLONOSCOPY WITH PROPOFOL;  Surgeon: Wyline Mood, MD;  Location: [facility] ENDOSCOPY;  Service:               Gastroenterology;  Laterality: N/A;  BMI    Body Mass Index: 28.04 kg/m      Reproductive/Obstetrics negative OB ROS                             Anesthesia Physical Anesthesia Plan  ASA: 3  Anesthesia Plan: General   Post-op Pain Management:    Induction: Intravenous  PONV Risk Score and Plan: Propofol  infusion and TIVA  Airway Management Planned: Natural Airway and Nasal Cannula  Additional Equipment:   Intra-op Plan:   Post-operative Plan:   Informed Consent: I have reviewed the patients History and Physical, chart, labs and discussed the procedure including the risks, benefits and alternatives for the proposed anesthesia with the patient or authorized representative who has indicated his/her understanding and acceptance.     Dental Advisory Given  Plan Discussed with: Anesthesiologist, CRNA and Surgeon  Anesthesia Plan Comments: (Patient consented for risks of anesthesia including but not limited to:  - adverse reactions to medications - risk of airway placement if required - damage to eyes, teeth, lips or other oral mucosa - nerve damage due to positioning  - sore throat or hoarseness - Damage to heart, brain, nerves, lungs, other parts of body or loss of life  Patient voiced understanding and assent.)       Anesthesia Quick Evaluation

## 2023-05-06 NOTE — Op Note (Signed)
[facility] Gastroenterology Patient Name: Terry Cameron Procedure Date: 05/06/2023 3:12 PM MRN: [id number] Account #: [id number] Date of Birth: [birth year] Admit Type: Outpatient Age: 77 Room: [facility] ENDO ROOM 3 Gender: Male Note Status: Finalized Instrument Name: Prentice Docker [id number] Procedure:             Colonoscopy Indications:           Surveillance: Personal history of adenomatous polyps                         on last colonoscopy 3 years ago, Last colonoscopy:                         December 2021 Providers:             Toney Reil MD, MD Referring MD:          No Local Md, MD (Referring MD) Medicines:             General Anesthesia Complications:         No immediate complications. Estimated blood loss: None. Procedure:             Pre-Anesthesia Assessment:                        - Prior to the procedure, a History and Physical was                         performed, and patient medications and allergies were                         reviewed. The patient is competent. The risks and                         benefits of the procedure and the sedation options and                         risks were discussed with the patient. All questions                         were answered and informed consent was obtained.                         Patient identification and proposed procedure were                         verified by the physician, the nurse, the                         anesthesiologist, the anesthetist and the technician                         in the pre-procedure area in the procedure room in the                         endoscopy suite. Mental Status Examination: alert and                         oriented. Airway Examination: normal oropharyngeal  airway and neck mobility. Respiratory Examination:                         clear to auscultation. CV Examination: normal.                         Prophylactic Antibiotics: The patient  does not require                         prophylactic antibiotics. Prior Anticoagulants: The                         patient has taken no anticoagulant or antiplatelet                         agents. ASA Grade Assessment: III - A patient with                         severe systemic disease. After reviewing the risks and                         benefits, the patient was deemed in satisfactory                         condition to undergo the procedure. The anesthesia                         plan was to use general anesthesia. Immediately prior                         to administration of medications, the patient was                         re-assessed for adequacy to receive sedatives. The                         heart rate, respiratory rate, oxygen saturations,                         blood pressure, adequacy of pulmonary ventilation, and                         response to care were monitored throughout the                         procedure. The physical status of the patient was                         re-assessed after the procedure.                        After obtaining informed consent, the colonoscope was                         passed under direct vision. Throughout the procedure,                         the patient's blood pressure, pulse, and oxygen  saturations were monitored continuously. The                         Colonoscope was introduced through the anus and                         advanced to the the cecum, identified by appendiceal                         orifice and ileocecal valve. The colonoscopy was                         unusually difficult due to significant looping.                         Successful completion of the procedure was aided by                         applying abdominal pressure. The patient tolerated the                         procedure well. The quality of the bowel preparation                         was adequate. The  ileocecal valve, appendiceal                         orifice, and rectum were photographed. Findings:      The perianal and digital rectal examinations were normal. Pertinent       negatives include normal sphincter tone and no palpable rectal lesions.      Seven sessile polyps were found in the ascending colon. The polyps were       3 to 6 mm in size. These polyps were removed with a cold snare.       Resection and retrieval were complete. Estimated blood loss: none.      Three sessile polyps were found in the transverse colon. The polyps were       3 to 6 mm in size. These polyps were removed with a cold snare.       Resection and retrieval were complete. Estimated blood loss: none.      A 4 mm polyp was found in the descending colon. The polyp was sessile.       The polyp was removed with a cold snare. Resection and retrieval were       complete.      Multiple small-mouthed diverticula were found in the recto-sigmoid colon       and sigmoid colon.      The retroflexed view of the distal rectum and anal verge was normal and       showed no anal or rectal abnormalities. Impression:            - Seven 3 to 6 mm polyps in the ascending colon,                         removed with a cold snare. Resected and retrieved.                        - Three 3 to 6 mm  polyps in the transverse colon,                         removed with a cold snare. Resected and retrieved.                        - One 4 mm polyp in the descending colon, removed with                         a cold snare. Resected and retrieved.                        - Diverticulosis in the recto-sigmoid colon and in the                         sigmoid colon.                        - The distal rectum and anal verge are normal on                         retroflexion view. Recommendation:        - Discharge patient to home (with escort).                        - Resume previous diet today.                        - Continue present  medications.                        - Await pathology results.                        - Repeat colonoscopy in 1 year for surveillance of                         multiple polyps. Procedure Code(s):     --- Professional ---                        [phone], Colonoscopy, flexible; with removal of                         tumor(s), polyp(s), or other lesion(s) by snare                         technique Diagnosis Code(s):     --- Professional ---                        Z86.010, Personal history of colonic polyps                        D12.2, Benign neoplasm of ascending colon                        D12.3, Benign neoplasm of transverse colon (hepatic                         flexure or splenic flexure)  K57.30, Diverticulosis of large intestine without                         perforation or abscess without bleeding                        D12.4, Benign neoplasm of descending colon CPT copyright 2022 American Medical Association. All rights reserved. The codes documented in this report are preliminary and upon coder review may  be revised to meet current compliance requirements. Dr. Libby Maw Toney Reil MD, MD 05/06/2023 3:56:30 PM This report has been signed electronically. Number of Addenda: 0 Note Initiated On: 05/06/2023 3:12 PM Scope Withdrawal Time: 0 hours 16 minutes 9 seconds  Total Procedure Duration: 0 hours 26 minutes 29 seconds  Estimated Blood Loss:  Estimated blood loss: none.      [facility]

## 2023-05-06 NOTE — Anesthesia Procedure Notes (Signed)
Date/Time: 05/06/2023 3:20 PM  Performed by: Malva Cogan, CRNAPre-anesthesia Checklist: Patient identified, Emergency Drugs available, Suction available, Patient being monitored and Timeout performed Patient Re-evaluated:Patient Re-evaluated prior to induction Oxygen Delivery Method: Nasal cannula Induction Type: IV induction Placement Confirmation: CO2 detector and positive ETCO2

## 2023-05-07 ENCOUNTER — Encounter: Payer: Self-pay | Admitting: Gastroenterology

## 2023-05-07 ENCOUNTER — Telehealth: Payer: Self-pay

## 2023-05-07 DIAGNOSIS — Z860100 Personal history of colon polyps, unspecified: Secondary | ICD-10-CM

## 2023-05-07 LAB — SURGICAL PATHOLOGY

## 2023-05-07 NOTE — Telephone Encounter (Signed)
-----   Message from [facility] sent at 05/07/2023 12:07 AM EST ----- Regarding: Referral to genetics Please refer him to genetic counselor at [facility] Multiple adenomas of the colon greater than 10 number  RV

## 2023-05-07 NOTE — Telephone Encounter (Signed)
Placed referral to genetic counselor

## 2023-05-11 ENCOUNTER — Encounter: Payer: Self-pay | Admitting: Gastroenterology

## 2023-05-18 ENCOUNTER — Telehealth: Payer: Self-pay | Admitting: Genetic Counselor

## 2023-05-18 ENCOUNTER — Encounter: Payer: Self-pay | Admitting: Family Medicine

## 2023-05-18 NOTE — Telephone Encounter (Signed)
Patient called to get scheduled for genetics per referral. Patient is aware of the made appointments.

## 2023-05-20 NOTE — Telephone Encounter (Signed)
 See other message

## 2023-05-22 DIAGNOSIS — G4733 Obstructive sleep apnea (adult) (pediatric): Secondary | ICD-10-CM | POA: Diagnosis not present

## 2023-07-03 ENCOUNTER — Other Ambulatory Visit: Payer: Self-pay | Admitting: Family Medicine

## 2023-07-03 DIAGNOSIS — E782 Mixed hyperlipidemia: Secondary | ICD-10-CM

## 2023-08-23 ENCOUNTER — Other Ambulatory Visit: Payer: Self-pay

## 2023-08-23 MED ORDER — TAMSULOSIN HCL 0.4 MG PO CAPS: 0.4000 mg | ORAL_CAPSULE | Freq: Every day | ORAL | 1 refills | Status: DC

## 2023-08-26 ENCOUNTER — Inpatient Hospital Stay: Payer: Medicare HMO

## 2023-08-26 ENCOUNTER — Encounter: Payer: Medicare HMO | Admitting: Genetic Counselor

## 2023-08-26 DIAGNOSIS — N182 Chronic kidney disease, stage 2 (mild): Secondary | ICD-10-CM | POA: Diagnosis not present

## 2023-08-26 DIAGNOSIS — D631 Anemia in chronic kidney disease: Secondary | ICD-10-CM | POA: Diagnosis not present

## 2023-08-31 ENCOUNTER — Encounter: Payer: Medicare HMO | Admitting: Nurse Practitioner

## 2023-09-06 ENCOUNTER — Encounter: Payer: Self-pay | Admitting: Genetic Counselor

## 2023-09-06 ENCOUNTER — Inpatient Hospital Stay

## 2023-09-06 ENCOUNTER — Inpatient Hospital Stay: Payer: Medicare HMO | Attending: Genetic Counselor | Admitting: Genetic Counselor

## 2023-09-06 DIAGNOSIS — Z860100 Personal history of colon polyps, unspecified: Secondary | ICD-10-CM

## 2023-09-06 DIAGNOSIS — Z860101 Personal history of adenomatous and serrated colon polyps: Secondary | ICD-10-CM | POA: Diagnosis not present

## 2023-09-06 DIAGNOSIS — D2271 Melanocytic nevi of right lower limb, including hip: Secondary | ICD-10-CM | POA: Diagnosis not present

## 2023-09-06 DIAGNOSIS — D2261 Melanocytic nevi of right upper limb, including shoulder: Secondary | ICD-10-CM | POA: Diagnosis not present

## 2023-09-06 DIAGNOSIS — Z08 Encounter for follow-up examination after completed treatment for malignant neoplasm: Secondary | ICD-10-CM | POA: Diagnosis not present

## 2023-09-06 DIAGNOSIS — L439 Lichen planus, unspecified: Secondary | ICD-10-CM | POA: Diagnosis not present

## 2023-09-06 DIAGNOSIS — D369 Benign neoplasm, unspecified site: Secondary | ICD-10-CM | POA: Diagnosis not present

## 2023-09-06 DIAGNOSIS — Z85828 Personal history of other malignant neoplasm of skin: Secondary | ICD-10-CM | POA: Diagnosis not present

## 2023-09-06 DIAGNOSIS — D225 Melanocytic nevi of trunk: Secondary | ICD-10-CM | POA: Diagnosis not present

## 2023-09-06 DIAGNOSIS — D485 Neoplasm of uncertain behavior of skin: Secondary | ICD-10-CM | POA: Diagnosis not present

## 2023-09-06 DIAGNOSIS — Z8042 Family history of malignant neoplasm of prostate: Secondary | ICD-10-CM

## 2023-09-06 DIAGNOSIS — L570 Actinic keratosis: Secondary | ICD-10-CM | POA: Diagnosis not present

## 2023-09-06 DIAGNOSIS — L821 Other seborrheic keratosis: Secondary | ICD-10-CM | POA: Diagnosis not present

## 2023-09-06 DIAGNOSIS — D2262 Melanocytic nevi of left upper limb, including shoulder: Secondary | ICD-10-CM | POA: Diagnosis not present

## 2023-09-06 DIAGNOSIS — D2272 Melanocytic nevi of left lower limb, including hip: Secondary | ICD-10-CM | POA: Diagnosis not present

## 2023-09-06 LAB — GENETIC SCREENING ORDER

## 2023-09-06 NOTE — Progress Notes (Signed)
 REFERRING PROVIDER: Selena Daily, MD [address] [place],  [place] [address]  PRIMARY PROVIDER:  Bluford Burkitt, NP  PRIMARY REASON FOR VISIT:  Encounter Diagnoses  Name Primary?   History of colonic polyps Yes   Multiple adenomatous polyps    Family history of prostate Cameron     HISTORY OF PRESENT ILLNESS:   Terry Cameron, a 79 y.o. male, was seen for a Terry Cameron genetics consultation at the request of Dr. Baldomero Bone due to a personal history of colon polyps.  Terry Cameron presents to clinic today to discuss the possibility of a hereditary predisposition to Cameron, to discuss genetic testing, and to further clarify his future Cameron risks, as well as potential Cameron risks for family members.   Terry Cameron is a 78 y.o. male with no personal history of Cameron.  He has a history of more than 20 tubular adenomas.  2024 colonoscopy: 11 tubular adenomas  2021 colonoscopy: 5 tubular adenomas  2018 colonoscopy: 12 tubular adenomas  Patient had colonoscopies in [place] prior to 2018; records not available.    Cameron HISTORY:  Oncology History   No history exists.     SCREENING/RISK FACTORS:  Colonoscopy: see history noted above; f/u in 1 year.  PSA testing: unknown  Dermatology: most recent skin check today    Past Medical History:  Diagnosis Date   Anemia    Arthritis    Chicken pox    Emphysema of lung (HCC)    Hyperlipidemia    Sleep apnea     Past Surgical History:  Procedure Laterality Date   BASAL CELL CARCINOMA EXCISION     COLONOSCOPY WITH PROPOFOL  N/A 02/22/2017   Procedure: COLONOSCOPY WITH PROPOFOL ;  Surgeon: Luke Salaam, MD;  Location: [facility] ENDOSCOPY;  Service: Gastroenterology;  Laterality: N/A;   COLONOSCOPY WITH PROPOFOL  N/A 04/26/2020   Procedure: COLONOSCOPY WITH PROPOFOL ;  Surgeon: Luke Salaam, MD;  Location: [facility] ENDOSCOPY;  Service: Gastroenterology;  Laterality: N/A;   COLONOSCOPY WITH PROPOFOL  N/A 05/06/2023   Procedure: COLONOSCOPY WITH  PROPOFOL ;  Surgeon: Selena Daily, MD;  Location: [facility] ENDOSCOPY;  Service: Gastroenterology;  Laterality: N/A;    FAMILY HISTORY:  We obtained a detailed, 4-generation family history.  Significant diagnoses are listed below: Family History  Problem Relation Age of Onset   Prostate Cameron Father        dx 23s   Liver Cameron Cousin 7       mat male cousin   Cameron Cousin        unknown type; d. 28s     Mr. Roddey is unaware of previous family history of genetic testing for hereditary Cameron risks.  Other relatives are unavailable for genetic testing at this time.   Patient's maternal ancestors are of [place] descent, and paternal ancestors are of [place] descent. There is no reported Ashkenazi Jewish ancestry. There is no known consanguinity.  GENETIC COUNSELING ASSESSMENT: Mr. Albright is a 78 y.o. male with a personal history of more than 20 lifetime tubular adenomas which is somewhat suggestive of a hereditary polyposis syndrome. We, therefore, discussed and recommended the following at today's visit.   DISCUSSION: We discussed that polyps in general are common, however, most people have fewer than 5 lifetime polyps.  When an individual has 10 or more polyps we become concerned about an underlying polyposis syndrome.  The most common hereditary polyposis syndromes are Familial Adenomatous Polyposis (FAP), caused by mutations in the APC gene, and MUTYH-Associated Polyposis (MAP), caused by mutations  in the MUTYH gene.  We discussed that testing is beneficial for several reasons, including knowing about Cameron risks, identifying potential screening and risk-reduction options that may be appropriate and to understand if other family members could be at increased risk for colon polyps and/or Cameron and allow them to undergo genetic testing.   We reviewed the characteristics, features and inheritance patterns of hereditary Cameron syndromes. We also discussed genetic testing, including the  appropriate family members to test, the process of testing, insurance coverage and turn-around-time for results. We discussed the implications of a negative, positive and/or variant of uncertain significant result. We recommended Terry Cameron pursue genetic testing for genes associated with colon polyps/Cameron, prostate Cameron, and other cancers.   Mr. Deboard  was offered a common hereditary Cameron panel (~40 genes) and an expanded pan-Cameron panel (~70 genes). Terry Cameron was informed of the benefits and limitations of each panel, including that expanded pan-Cameron panels contain genes that do not have clear management guidelines at this point in time.  We also discussed that as the number of genes included on a panel increases, the chances of variants of uncertain significance increases.  After considering the benefits and limitations of each gene panel, Terry Cameron  elected to have an expanded pan-Cameron panel through [employer]  The CancerNext-Expanded gene panel offered by [facility] and includes sequencing, rearrangement, and RNA analysis for the following 76 genes: AIP, ALK, APC, ATM, AXIN2, BAP1, BARD1, BMPR1A, BRCA1, BRCA2, BRIP1, CDC73, CDH1, CDK4, CDKN1B, CDKN2A, CEBPA, CHEK2, CTNNA1, DDX41, DICER1, ETV6, FH, FLCN, GATA2, LZTR1, MAX, MBD4, MEN1, MET, MLH1, MSH2, MSH3, MSH6, MUTYH, NF1, NF2, NTHL1, PALB2, PHOX2B, PMS2, POT1, PRKAR1A, PTCH1, PTEN, RAD51C, RAD51D, RB1, RET, RUNX1, SDHA, SDHAF2, SDHB, SDHC, SDHD, SMAD4, SMARCA4, SMARCB1, SMARCE1, STK11, SUFU, TMEM127, TP53, TSC1, TSC2, VHL, and WT1 (sequencing and deletion/duplication); EGFR, HOXB13, KIT, MITF, PDGFRA, POLD1, and POLE (sequencing only); EPCAM and GREM1 (deletion/duplication only).   Based on Terry Cameron personal history of more than 20 tubular adenomas, he meets NCCN medical criteria for genetic testing.  He is the most informative relative to have genetic testing. Despite that he meets criteria, he may still have an out of  pocket cost. We discussed that if his out of pocket cost for testing is over $100, the laboratory should contact him and discuss the self-pay prices and/or patient pay assistance programs.    We discussed the Genetic Information Non-Discrimination Act (GINA) of 2008, which helps protect individuals against genetic discrimination based on their genetic test results.  It impacts both health insurance and employment.  With health insurance, it protects against genetic test results being used for increased premiums or policy termination. For employment, it protects against hiring, firing and promoting decisions based on genetic test results.  GINA does not apply to those in the [employer], those who work for companies with less than 15 employees, and new life insurance or long-term disability insurance policies.  Health status due to a Cameron diagnosis is not protected under GINA.  PLAN: After considering the risks, benefits, and limitations, Mr. Burch provided informed consent to pursue genetic testing and the blood sample was sent to [facility] for analysis of the CancerNext-Expanded +RNAinsight Panel. Results should be available within approximately 3 weeks, at which point they will be disclosed by telephone to Mr. Nickolson, as will any additional recommendations warranted by these results. Mr. Sommers will receive a summary of his genetic counseling visit and a copy of his results once available. This information will also  be available in Epic.   Mr. Domangue questions were answered to his satisfaction today. Our contact information was provided should additional questions or concerns arise. Thank you for the referral and allowing us  to share in the care of your patient.   Edman Lipsey M. Ora Billing, MS, [facility] Genetic Counselor [email or web address] (P) [phone]   40 minutes were spent on the date of the encounter in service to the patient including preparation, face-to-face consultation,  documentation and care coordination.  The patient was seen alone.  Drs. Iruku, Gudena and/or Maryalice Smaller were available to discuss this case as needed.   _______________________________________________________________________ For Office Staff:  Number of people involved in session: 1 Was an Intern/ student involved with case: yes; Insurance account manager

## 2023-09-07 ENCOUNTER — Encounter: Payer: Self-pay | Admitting: Nurse Practitioner

## 2023-09-07 ENCOUNTER — Ambulatory Visit (INDEPENDENT_AMBULATORY_CARE_PROVIDER_SITE_OTHER): Payer: Medicare HMO | Admitting: Nurse Practitioner

## 2023-09-07 VITALS — BP 124/66 | HR 78 | Temp 97.6°F | Ht 70.0 in | Wt 196.8 lb

## 2023-09-07 DIAGNOSIS — R350 Frequency of micturition: Secondary | ICD-10-CM | POA: Diagnosis not present

## 2023-09-07 DIAGNOSIS — E782 Mixed hyperlipidemia: Secondary | ICD-10-CM | POA: Diagnosis not present

## 2023-09-07 DIAGNOSIS — M24542 Contracture, left hand: Secondary | ICD-10-CM

## 2023-09-07 DIAGNOSIS — N182 Chronic kidney disease, stage 2 (mild): Secondary | ICD-10-CM | POA: Insufficient documentation

## 2023-09-07 DIAGNOSIS — D369 Benign neoplasm, unspecified site: Secondary | ICD-10-CM

## 2023-09-07 DIAGNOSIS — G4733 Obstructive sleep apnea (adult) (pediatric): Secondary | ICD-10-CM | POA: Diagnosis not present

## 2023-09-07 NOTE — Assessment & Plan Note (Signed)
 Multiple polyps noted with recent colonoscopy. Genetic counseling conducted, awaiting genetic testing results. Repeat colonoscopy in 1 year.

## 2023-09-07 NOTE — Assessment & Plan Note (Signed)
 Monitored by hand specialist with no significant worsening. Hx of injections. Continue monitoring.

## 2023-09-07 NOTE — Assessment & Plan Note (Signed)
 Managed with Flomax  0.4 mg daily. No side effects reports. Improvement noted. Continue Flomax .

## 2023-09-07 NOTE — Assessment & Plan Note (Signed)
 Managed with CPAP nightly, and he reports feeling well-rested. Continue.

## 2023-09-07 NOTE — Assessment & Plan Note (Signed)
 Chronic kidney disease shows improved function, GFR- 63. Follow up with nephrology in 6 months. Advised to avoid NSAIDs and encourage adequate hydration.

## 2023-09-07 NOTE — Assessment & Plan Note (Signed)
 Managed with Lipitor, with no reported side effects. Continue Lipitor 10 mg daily.

## 2023-09-07 NOTE — Progress Notes (Signed)
 Terry Burkitt, NP-C Phone: [phone]  Terry Cameron is a 78 y.o. male who presents today for transfer of care.   Discussed the use of AI scribe software for clinical note transcription with the patient, who gave verbal consent to proceed.  History of Present Illness   Terry Cameron is a 78 year old male with colon polyps and chronic kidney disease who presents for a transfer of care visit.  He recently underwent a colonoscopy where approximately fifteen polyps were found, all of which were benign. He also visited a genetic counselor to assess for any hereditary conditions related to the polyps and underwent a blood test screening for multiple genetic disorders.  He has a history of chronic kidney disease and recently saw a nephrologist who noted improvement in his kidney function compared to previous levels, with current kidney function at 66%.  He is currently taking Lipitor for cholesterol management and reports no new issues such as abdominal pain or muscle aches. He is also on Flomax  and notes no significant urinary symptoms.  He uses a CPAP machine for sleep apnea and reports using it nightly, though he does not always feel well-rested in the morning.  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  He has a history of arthritis, trigger finger, and back pain, which he states has been better recently. He is retired and enjoys golfing, which he does regularly.      Social History   Tobacco Use  Smoking Status Former   Current packs/day: 0.00   Types: Cigarettes   Quit date: 1974   Years since quitting: 51.3  Smokeless Tobacco Never  Tobacco Comments   SMOKED FOR ABOUT 8 YEARS    Current Outpatient Medications on File Prior to Visit  Medication Sig Dispense Refill   atorvastatin  (LIPITOR) 10 MG tablet TAKE 1 TABLET BY MOUTH EVERY DAY 90  tablet 1   Azelastine  HCl 137 MCG/SPRAY SOLN PLACE 2 SPRAYS INTO BOTH NOSTRILS 2 (TWO) TIMES DAILY. USE IN EACH NOSTRIL AS DIRECTED 90 mL 1   tamsulosin  (FLOMAX ) 0.4 MG CAPS capsule Take 1 capsule (0.4 mg total) by mouth daily. 90 capsule 1   No current facility-administered medications on file prior to visit.     ROS see history of present illness  Objective  Physical Exam Vitals:   09/07/23 0907  BP: 124/66  Pulse: 78  Temp: 97.6 F (36.4 C)  SpO2: 96%    BP Readings from Last 3 Encounters:  09/07/23 124/66  05/06/23 137/70  01/27/23 116/72   Wt Readings from Last 3 Encounters:  09/07/23 196 lb 12.8 oz (89.3 kg)  05/06/23 195 lb 6.4 oz (88.6 kg)  01/27/23 205 lb 9.6 oz (93.3 kg)    Physical Exam Constitutional:      General: He is not in acute distress.    Appearance: Normal appearance.  HENT:     Head: Normocephalic.  Cardiovascular:     Rate and Rhythm: Normal rate and regular rhythm.     Heart sounds: Normal heart sounds.  Pulmonary:     Effort: Pulmonary effort is normal.     Breath sounds: Normal breath sounds.  Skin:    General: Skin is warm and dry.  Neurological:     General: No focal deficit present.     Mental Status: He is alert.  Psychiatric:  Mood and Affect: Mood normal.        Behavior: Behavior normal.      Assessment/Plan: Please see individual problem list.  Multiple adenomatous polyps Assessment & Plan: Multiple polyps noted with recent colonoscopy. Genetic counseling conducted, awaiting genetic testing results. Repeat colonoscopy in 1 year.   CKD (chronic kidney disease) stage 2, GFR 60-89 ml/min Assessment & Plan: Chronic kidney disease shows improved function, GFR- 63. Follow up with nephrology in 6 months. Advised to avoid NSAIDs and encourage adequate hydration.    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 Hx of injections. Continue  monitoring.    OSA (obstructive sleep apnea) Assessment & Plan: Managed with CPAP nightly, and he reports feeling well-rested. Continue.    Mixed hyperlipidemia Assessment & Plan: Managed with Lipitor, with no reported side effects. Continue Lipitor 10 mg daily.    Urine frequency Assessment & Plan: Managed with Flomax  0.4 mg daily. No side effects reports. Improvement noted. Continue Flomax .     Return in about 5 months (around 01/27/2024) for Annual Exam, sooner as needed.   Terry Burkitt, NP-C [facility]

## 2023-09-08 ENCOUNTER — Encounter: Payer: Self-pay | Admitting: Emergency Medicine

## 2023-09-08 ENCOUNTER — Ambulatory Visit
Admission: EM | Admit: 2023-09-08 | Discharge: 2023-09-08 | Disposition: A | Discharge status: Home / Self Care | Attending: Emergency Medicine | Admitting: Emergency Medicine

## 2023-09-08 DIAGNOSIS — S0990XA Unspecified injury of head, initial encounter: Secondary | ICD-10-CM | POA: Diagnosis not present

## 2023-09-08 DIAGNOSIS — S80211A Abrasion, right knee, initial encounter: Secondary | ICD-10-CM

## 2023-09-08 DIAGNOSIS — W19XXXA Unspecified fall, initial encounter: Secondary | ICD-10-CM

## 2023-09-08 DIAGNOSIS — Z23 Encounter for immunization: Secondary | ICD-10-CM

## 2023-09-08 DIAGNOSIS — S50311A Abrasion of right elbow, initial encounter: Secondary | ICD-10-CM

## 2023-09-08 DIAGNOSIS — S0081XA Abrasion of other part of head, initial encounter: Secondary | ICD-10-CM

## 2023-09-08 MED ORDER — TETANUS-DIPHTH-ACELL PERTUSSIS 5-2.5-18.5 LF-MCG/0.5 IM SUSY
0.5000 mL | PREFILLED_SYRINGE | Freq: Once | INTRAMUSCULAR | Status: AC
  Administered 2023-09-08: 0.5 mL via INTRAMUSCULAR

## 2023-09-08 MED ORDER — MUPIROCIN 2 % EX OINT: 1.0000 | TOPICAL_OINTMENT | Freq: Two times a day (BID) | CUTANEOUS | 0 refills | Status: DC

## 2023-09-08 NOTE — Discharge Instructions (Addendum)
 Go to the emergency department if you have concerning symptoms.  See the attached information on head injury.    Your tetanus was updated today.  Keep your wounds clean and dry.  Wash them gently twice a day with soap and water.  Apply the mupirocin  ointment as directed.  Follow-up right away if you see signs of infection.

## 2023-09-08 NOTE — ED Triage Notes (Signed)
 Pt was sitting in a high swivel chair today and fell out of it hitting the concrete at home. Pt has an abrasion on right facial check, right knee and right elbow.

## 2023-09-08 NOTE — ED Provider Notes (Signed)
 Terry Cameron    CSN: [id number] Arrival date & time: 09/08/23  1751      History   Chief Complaint Chief Complaint  Patient presents with   Fall    HPI Terry Cameron is a 78 y.o. male.  Accompanied by his wife, patient presents with abrasions on the right side of his face, right elbow, right knee after he fell asleep while sitting in a barstool and fell.  He states he had had a beer and then 2 shots of tequila.  He denies dizziness, weakness, numbness, vision change, chest pain, shortness of breath, abdominal pain.  No treatments at home.  Last tetanus 2018.  The history is provided by the patient, the spouse and medical records.    Past Medical History:  Diagnosis Date   Anemia    Arthritis    Chicken pox    Emphysema of lung (HCC)    Hyperlipidemia    Sleep apnea     Patient Active Problem List   Diagnosis Date Noted   CKD (chronic kidney disease) stage 2, GFR 60-89 ml/min 09/07/2023   History of colonic polyps 05/06/2023   Multiple adenomatous polyps 05/06/2023   Rash 01/27/2023   Left hip pain 06/12/2022   Low back pain 06/08/2022   Sore throat 04/02/2022   Urine frequency 12/10/2021   Allergic rhinitis 12/10/2021   Bug bite 12/10/2021   Encounter for general adult medical examination with abnormal findings 08/29/2021   Overweight 08/29/2021   Abrasion of palm 08/27/2020   Osteoarthritis 08/27/2020   Tinnitus 06/09/2019   Skin fragility 12/02/2018   Digital mucous cyst of left hand 07/14/2018   Trigger finger of left hand 07/14/2018   Trigger little finger of left hand 06/03/2018   Erectile dysfunction 06/03/2018   Prediabetes 10/15/2017   Hyperlipidemia 01/01/2017   OSA (obstructive sleep apnea) 01/01/2017   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 08/29/2015    Past Surgical History:  Procedure Laterality Date   BASAL CELL CARCINOMA EXCISION     COLONOSCOPY WITH PROPOFOL  N/A 02/22/2017   Procedure: COLONOSCOPY WITH PROPOFOL ;   Surgeon: Luke Salaam, MD;  Location: [facility] ENDOSCOPY;  Service: Gastroenterology;  Laterality: N/A;   COLONOSCOPY WITH PROPOFOL  N/A 04/26/2020   Procedure: COLONOSCOPY WITH PROPOFOL ;  Surgeon: Luke Salaam, MD;  Location: [facility] ENDOSCOPY;  Service: Gastroenterology;  Laterality: N/A;   COLONOSCOPY WITH PROPOFOL  N/A 05/06/2023   Procedure: COLONOSCOPY WITH PROPOFOL ;  Surgeon: Selena Daily, MD;  Location: [facility] ENDOSCOPY;  Service: Gastroenterology;  Laterality: N/A;       Home Medications    Prior to Admission medications   Medication Sig Start Date End Date Taking? Authorizing Provider  mupirocin  ointment (BACTROBAN ) 2 % Apply 1 Application topically 2 (two) times daily. 09/08/23  Yes Wellington Half, NP  atorvastatin  (LIPITOR) 10 MG tablet TAKE 1 TABLET BY MOUTH EVERY DAY 07/05/23   Kent Pear, MD  Azelastine  HCl 137 MCG/SPRAY SOLN PLACE 2 SPRAYS INTO BOTH NOSTRILS 2 (TWO) TIMES DAILY. USE IN EACH NOSTRIL AS DIRECTED 03/01/23   Kent Pear, MD  tamsulosin  (FLOMAX ) 0.4 MG CAPS capsule Take 1 capsule (0.4 mg total) by mouth daily. 08/23/23   Thersia Flax, MD    Family History Family History  Problem Relation Age of Onset   Arthritis Mother    Hypertension Father    Heart attack Father    Prostate cancer Father        dx 31s   Diabetes  Maternal Grandmother    Glaucoma Maternal Grandmother    Liver cancer Cousin 72       mat male cousin   Cancer Cousin        unknown type; d. 67s    Social History Social History   Tobacco Use   Smoking status: Former    Current packs/day: 0.00    Types: Cigarettes    Quit date: 1974    Years since quitting: 51.3   Smokeless tobacco: Never   Tobacco comments:    SMOKED FOR ABOUT 8 YEARS  Vaping Use   Vaping status: Never Used  Substance Use Topics   Alcohol use: Yes    Comment: COUPLE TIMES A WEEK    Drug use: No     Allergies   Patient has no known allergies.   Review of Systems Review of Systems   Constitutional:  Negative for chills and fever.  HENT:  Negative for ear discharge and rhinorrhea.   Eyes:  Negative for visual disturbance.  Respiratory:  Negative for cough and shortness of breath.   Cardiovascular:  Negative for chest pain and palpitations.  Gastrointestinal:  Negative for abdominal pain, nausea and vomiting.  Musculoskeletal:  Positive for arthralgias. Negative for gait problem and joint swelling.  Skin:  Positive for color change and wound.  Neurological:  Negative for dizziness, weakness, numbness and headaches.     Physical Exam Triage Vital Signs ED Triage Vitals  Encounter Vitals Group     BP      Systolic BP Percentile      Diastolic BP Percentile      Pulse      Resp      Temp      Temp src      SpO2      Weight      Height      Head Circumference      Peak Flow      Pain Score      Pain Loc      Pain Education      Exclude from Growth Chart    No data found.  Updated Vital Signs BP 124/62   Pulse 95   Temp 98.4 F (36.9 C)   Resp 18   SpO2 98%   Visual Acuity Right Eye Distance:   Left Eye Distance:   Bilateral Distance:    Right Eye Near:   Left Eye Near:    Bilateral Near:     Physical Exam Constitutional:      General: He is not in acute distress. HENT:     Mouth/Throat:     Mouth: Mucous membranes are moist.  Cardiovascular:     Rate and Rhythm: Normal rate and regular rhythm.     Heart sounds: Normal heart sounds.  Pulmonary:     Effort: Pulmonary effort is normal. No respiratory distress.     Breath sounds: Normal breath sounds.  Musculoskeletal:        General: No swelling, tenderness or deformity. Normal range of motion.  Skin:    General: Skin is warm and dry.     Capillary Refill: Capillary refill takes less than 2 seconds.     Findings: Lesion present.     Comments: Superficial abrasions on right facial cheek, right elbow, right knee. No active bleeding.   Neurological:     General: No focal deficit  present.     Mental Status: He is alert and oriented to person, place, and time.  Cranial Nerves: No cranial nerve deficit.     Sensory: No sensory deficit.     Motor: No weakness.     Gait: Gait normal.  Psychiatric:        Mood and Affect: Mood normal.        Behavior: Behavior normal.      UC Treatments / Results  Labs (all labs ordered are listed, but only abnormal results are displayed) Labs Reviewed - No data to display  EKG   Radiology No results found.  Procedures Procedures (including critical care time)  Medications Ordered in UC Medications  Tdap (BOOSTRIX ) injection 0.5 mL (has no administration in time range)    Initial Impression / Assessment and Plan / UC Course  I have reviewed the triage vital signs and the nursing notes.  Pertinent labs & imaging results that were available during my care of the patient were reviewed by me and considered in my medical decision making (see chart for details).    Abrasions of right cheek, right elbow, right knee.  Fall at home.  Head injury.  Patient declines transfer to the ED.  He declines x-rays.  Tetanus updated today.  Wounds cleaned and dressed.  Treating with mupirocin  ointment.  Wound care instructions and signs of infection discussed.  Education provided on abrasion, falls, head injury.  Strict ED precautions discussed.  Patient and his wife agree to plan of care.  Final Clinical Impressions(s) / UC Diagnoses   Final diagnoses:  Abrasion of cheek, initial encounter  Abrasion of right elbow, initial encounter  Abrasion of right knee, initial encounter  Fall, initial encounter  Injury of head, initial encounter     Discharge Instructions      Go to the emergency department if you have concerning symptoms.  See the attached information on head injury.    Your tetanus was updated today.  Keep your wounds clean and dry.  Wash them gently twice a day with soap and water.  Apply the mupirocin  ointment as  directed.  Follow-up right away if you see signs of infection.      ED Prescriptions     Medication Sig Dispense Auth. Provider   mupirocin  ointment (BACTROBAN ) 2 % Apply 1 Application topically 2 (two) times daily. 22 g Wellington Half, NP      PDMP not reviewed this encounter.   Wellington Half, NP 09/08/23 Jerre Moots

## 2023-09-09 ENCOUNTER — Other Ambulatory Visit: Payer: Self-pay

## 2023-09-09 DIAGNOSIS — J309 Allergic rhinitis, unspecified: Secondary | ICD-10-CM

## 2023-09-09 MED ORDER — AZELASTINE HCL 137 MCG/SPRAY NA SOLN: 2.0000 | Freq: Two times a day (BID) | NASAL | 1 refills | Status: DC

## 2023-09-22 ENCOUNTER — Telehealth: Payer: Self-pay | Admitting: Genetic Counselor

## 2023-09-22 ENCOUNTER — Encounter: Payer: Self-pay | Admitting: Genetic Counselor

## 2023-09-22 DIAGNOSIS — Z1379 Encounter for other screening for genetic and chromosomal anomalies: Secondary | ICD-10-CM | POA: Insufficient documentation

## 2023-09-22 NOTE — Telephone Encounter (Signed)
 Contacted patient in attempt to disclose results of genetic testing.  LVM with contact information requesting a call back.

## 2023-10-01 ENCOUNTER — Ambulatory Visit: Payer: Self-pay | Admitting: Genetic Counselor

## 2023-10-01 DIAGNOSIS — Z8042 Family history of malignant neoplasm of prostate: Secondary | ICD-10-CM

## 2023-10-01 DIAGNOSIS — Z1379 Encounter for other screening for genetic and chromosomal anomalies: Secondary | ICD-10-CM

## 2023-10-01 DIAGNOSIS — Z860100 Personal history of colon polyps, unspecified: Secondary | ICD-10-CM

## 2023-10-01 DIAGNOSIS — D369 Benign neoplasm, unspecified site: Secondary | ICD-10-CM

## 2023-10-01 NOTE — Telephone Encounter (Signed)
 Second attempt at disclosing results.  LVM requesting call back to discuss.  Also let patient know results will be posted on MyChart and encouraged him to call to discuss.

## 2023-10-01 NOTE — Progress Notes (Signed)
 HPI:   [name] was previously seen in the  Cancer Genetics clinic due to a personal history of colon polyps and concerns regarding a hereditary predisposition to cancer.    [name] recent genetic test results were disclosed to him by MyChart message after being unable to reach him by telephone. These results and recommendations are discussed in more detail below.  CANCER HISTORY:  [name] is a 78 y.o. male with no personal history of cancer.  He has a history of more than 20 tubular adenomas.  2024 colonoscopy: 11 tubular adenomas  2021 colonoscopy: 5 tubular adenomas  2018 colonoscopy: 12 tubular adenomas  Patient had colonoscopies in [place] prior to 2018; records not available.     FAMILY HISTORY:  We obtained a detailed, 4-generation family history.  Significant diagnoses are listed below:      Family History  Problem Relation Age of Onset   Prostate cancer Father          dx 45s   Liver cancer Cousin 22        mat male cousin   Cancer Cousin          unknown type; d. 86s       [name] is unaware of previous family history of genetic testing for hereditary cancer risks.  Other relatives are unavailable for genetic testing at this time.    Patient's maternal ancestors are of [place] descent, and paternal ancestors are of [place] descent. There is no reported Ashkenazi Jewish ancestry. There is no known consanguinity.   GENETIC TEST RESULTS:  The [facility] CustomNext-Cancer +RNAinsigh Panel found no pathogenic mutations. The [facility] CustomNext-Cancer +RNAinsight Panel (CancerNext-Expanded + RNF43) includes sequencing, deletion/duplication, and RNA analysis for the following 78 genes:  AIP, ALK, APC, ATM, BAP1, BARD1, BMPR1A, BRCA1, BRCA2, BRIP1, CDC73, CDH1, CDK4, CDKN1B, CDKN2A, CEBPA, CHEK2, DICER1, ETV6, FH, FLCN, GATA2, LZTR1, MAX, MEN1, MET, MLH1, MSH2, MSH6, MUTYH, NF1, NF2, NTHL1, PALB2, PHOX2B, PMS2, POT1, PRKAR1A, PTCH1, PTEN, RAD51C, RAD51D, RB1, RET,  RPS20, RUNX1, SDHA, SDHAF2, SDHB, SDHC, SDHD, SMAD4, SMARCA4, SMARCB1, SMARCE1, STK11, SUFU, TMEM127, TP53, TSC1, TSC2, VHL and WT1 (sequencing and deletion/duplication); AXIN2, CTNNA1, DDX41, EGFR, HOXB13, KIT, MBD4, MITF, MSH3, PDGFRA, POLD1, POLE and RNF43 (sequencing only); EPCAM and GREM1 (deletion/duplication only). RNA data is routinely analyzed for use in variant interpretation for all genes.  The test report has been scanned into EPIC and is located under the Molecular Pathology section of the Results Review tab.  A portion of the result report is included below for reference. Genetic testing reported out on Sep 17, 2023.      Even though a pathogenic variant was not identified, possible explanations for the cancer/polyps in the family may include: There may be no hereditary risk for cancer in the family. The cancers/polyps in [name] and/or his family may be sporadic/familial or due to other genetic and environmental factors.  Most cancer is not hereditary.  There may be a gene mutation in one of these genes that current testing methods cannot detect but that chance is small. There could be another gene that has not yet been discovered, or that we have not yet tested, that is responsible for the cancer diagnoses in the family.  It is also possible there is a hereditary cause for the cancer in the family that [name] did not inherit.   Therefore, it is important to remain in touch with cancer genetics in the future so that we can continue to offer  [name] the most up to date genetic testing.     ADDITIONAL GENETIC TESTING:   [name] genetic testing was fairly extensive.  If there are additional relevant genes identified to increase cancer risk that can be analyzed in the future, we would be happy to discuss and coordinate this testing at that time.     CANCER SCREENING RECOMMENDATIONS:  [name] test result is considered negative (normal).  This means that we  have not identified a hereditary cause for his personal history of colon polyps at this time.   An individual's cancer risk and medical management are not determined by genetic test results alone. Overall cancer risk assessment incorporates additional factors, including personal medical history, family history, and any available genetic information that may result in a personalized plan for cancer prevention and surveillance. Therefore, it is recommended he continue to follow the cancer management and screening guidelines provided by his gastroenterology and primary healthcare provider.   RECOMMENDATIONS FOR FAMILY MEMBERS:   Since he did not inherit a identifiable mutation in a cancer predisposition gene included on this panel, his children could not have inherited a known mutation from him in one of these genes. Individuals in this family might be at some increased risk of developing cancer, over the general population risk, due to the family history of cancer.  Individuals in the family should notify their providers of the family history of cancer and colon polyps.  Initiation age and frequency of colonoscopy should be modified based on clinical judgment taking account into first-degree relative's history with respect to age and cumulative adenoma burden   FOLLOW-UP:  Cancer genetics is a rapidly advancing field and it is possible that new genetic tests will be appropriate for him and/or his family members in the future. We encourage [name] to remain in contact with cancer genetics, so we can update his personal and family histories and let him know of advances in cancer genetics that may benefit this family.   Our contact number was provided.  They are welcome to call us  at anytime with additional questions or concerns.   Danen Lapaglia M. Ora Billing, MS, [facility] Genetic Counselor [email or web address] (P) [phone]

## 2023-10-13 ENCOUNTER — Telehealth: Payer: Self-pay | Admitting: Genetic Counselor

## 2023-10-13 NOTE — Telephone Encounter (Signed)
 Patient LVM saying he saw MyChart results and wanted to follow up by phone.  Returned call and LVM requesting call back.

## 2023-10-13 NOTE — Telephone Encounter (Signed)
 Disclosed negative genetics.  See detailed chart note

## 2023-11-25 DIAGNOSIS — G4733 Obstructive sleep apnea (adult) (pediatric): Secondary | ICD-10-CM | POA: Diagnosis not present

## 2023-12-26 DIAGNOSIS — Z008 Encounter for other general examination: Secondary | ICD-10-CM | POA: Diagnosis not present

## 2024-01-03 ENCOUNTER — Other Ambulatory Visit: Payer: Self-pay

## 2024-01-03 DIAGNOSIS — E782 Mixed hyperlipidemia: Secondary | ICD-10-CM

## 2024-01-03 MED ORDER — ATORVASTATIN CALCIUM 10 MG PO TABS: 10.0000 mg | ORAL_TABLET | Freq: Every day | ORAL | 3 refills | Status: AC

## 2024-01-31 ENCOUNTER — Encounter: Payer: Medicare HMO | Admitting: Family Medicine

## 2024-02-04 ENCOUNTER — Ambulatory Visit: Payer: Medicare HMO | Admitting: Nurse Practitioner

## 2024-02-04 ENCOUNTER — Encounter: Payer: Self-pay | Admitting: Nurse Practitioner

## 2024-02-04 VITALS — BP 118/58 | HR 75 | Temp 97.6°F | Ht 70.0 in | Wt 197.8 lb

## 2024-02-04 DIAGNOSIS — N182 Chronic kidney disease, stage 2 (mild): Secondary | ICD-10-CM

## 2024-02-04 DIAGNOSIS — R7303 Prediabetes: Secondary | ICD-10-CM | POA: Diagnosis not present

## 2024-02-04 DIAGNOSIS — Z1329 Encounter for screening for other suspected endocrine disorder: Secondary | ICD-10-CM

## 2024-02-04 DIAGNOSIS — Z23 Encounter for immunization: Secondary | ICD-10-CM

## 2024-02-04 DIAGNOSIS — D369 Benign neoplasm, unspecified site: Secondary | ICD-10-CM

## 2024-02-04 DIAGNOSIS — Z0000 Encounter for general adult medical examination without abnormal findings: Secondary | ICD-10-CM

## 2024-02-04 DIAGNOSIS — E782 Mixed hyperlipidemia: Secondary | ICD-10-CM

## 2024-02-04 DIAGNOSIS — Z1211 Encounter for screening for malignant neoplasm of colon: Secondary | ICD-10-CM

## 2024-02-04 DIAGNOSIS — Z125 Encounter for screening for malignant neoplasm of prostate: Secondary | ICD-10-CM

## 2024-02-04 LAB — COMPREHENSIVE METABOLIC PANEL WITH GFR
ALT: 23 U/L (ref 0–53)
AST: 24 U/L (ref 0–37)
Albumin: 4.4 g/dL (ref 3.5–5.2)
Alkaline Phosphatase: 71 U/L (ref 39–117)
BUN: 23 mg/dL (ref 6–23)
CO2: 29 meq/L (ref 19–32)
Calcium: 10.1 mg/dL (ref 8.4–10.5)
Chloride: 107 meq/L (ref 96–112)
Creatinine, Ser: 1.27 mg/dL (ref 0.40–1.50)
GFR: 54.8 mL/min — ABNORMAL LOW (ref >60.00)
Glucose, Bld: 93 mg/dL (ref 70–99)
Potassium: 4.6 meq/L (ref 3.5–5.1)
Sodium: 141 meq/L (ref 135–145)
Total Bilirubin: 0.5 mg/dL (ref 0.2–1.2)
Total Protein: 7.1 g/dL (ref 6.0–8.3)

## 2024-02-04 LAB — LIPID PANEL
Cholesterol: 171 mg/dL (ref 0–200)
HDL: 51.7 mg/dL (ref >39.00)
LDL Cholesterol: 83 mg/dL (ref 0–99)
NonHDL: 118.99
Total CHOL/HDL Ratio: 3
Triglycerides: 182 mg/dL — ABNORMAL HIGH (ref 0.0–149.0)
VLDL: 36.4 mg/dL (ref 0.0–40.0)

## 2024-02-04 LAB — CBC WITH DIFFERENTIAL/PLATELET
Basophils Absolute: 0 K/uL (ref 0.0–0.1)
Basophils Relative: 0.5 % (ref 0.0–3.0)
Eosinophils Absolute: 0.3 K/uL (ref 0.0–0.7)
Eosinophils Relative: 5 % (ref 0.0–5.0)
HCT: 40.6 % (ref 39.0–52.0)
Hemoglobin: 13.5 g/dL (ref 13.0–17.0)
Lymphocytes Relative: 28.9 % (ref 12.0–46.0)
Lymphs Abs: 1.5 K/uL (ref 0.7–4.0)
MCHC: 33.1 g/dL (ref 30.0–36.0)
MCV: 94.2 fl (ref 78.0–100.0)
Monocytes Absolute: 0.5 K/uL (ref 0.1–1.0)
Monocytes Relative: 8.9 % (ref 3.0–12.0)
Neutro Abs: 2.9 K/uL (ref 1.4–7.7)
Neutrophils Relative %: 56.7 % (ref 43.0–77.0)
Platelets: 195 K/uL (ref 150.0–400.0)
RBC: 4.31 Mil/uL (ref 4.22–5.81)
RDW: 14.5 % (ref 11.5–15.5)
WBC: 5.2 K/uL (ref 4.0–10.5)

## 2024-02-04 LAB — TSH: TSH: 2.81 u[IU]/mL (ref 0.35–5.50)

## 2024-02-04 LAB — PSA: PSA: 2.85 ng/mL (ref 0.10–4.00)

## 2024-02-04 LAB — HEMOGLOBIN A1C: Hgb A1c MFr Bld: 6.4 % (ref 4.6–6.5)

## 2024-02-04 NOTE — Progress Notes (Unsigned)
 Leron Glance, NP-C Phone: [phone]  Terry Cameron is a 78 y.o. male who presents today for annual exam.   Discussed the use of AI scribe software for clinical note transcription with the patient, who gave verbal consent to proceed.  History of Present Illness   Terry Cameron is a 78 year old male who presents for annual exam.   He has a history of colon polyps, with the most recent colonoscopy in December 2024 revealing approximately twelve polyps, the highest number found in any of his colonoscopies. He is scheduled for a follow-up colonoscopy in December 2025. No family history of colon cancer is reported.  He experiences occasional lightheadedness, particularly when running up stairs or during physical exertion in hot weather. He recalls an incident where he felt unable to continue playing golf on a hot day. He notes efforts to stay hydrated, although his wife suggests he does not drink enough fluids. He has a history of donating blood regularly, approximately every two months, and mentions a previous issue with low iron levels, for which he has increased his iron supplement intake. He is blood type O negative.  No chest pain, shortness of breath, abdominal pain, or changes in bowel habits. He reports easy bruising and occasional lightheadedness but no headaches, trouble swallowing, or skin changes. He takes a B vitamin that causes his urine to appear yellow in the morning.  He maintains an active lifestyle, playing golf and pickleball regularly, and exercises on the elliptical for thirty minutes. He reports a well-balanced diet, including yogurt, fruit, [place], and cheese, and shares meals with his wife. He has a history of using hearing aids, which he wears in crowded settings. No mood changes, anxiety, or depression, and he reports good sleep quality, although he occasionally wakes up during the night.  He is up to date on his vaccinations, including flu, tetanus, COVID-19,  shingles, and pneumonia. He quit smoking seven years ago and consumes alcohol moderately, approximately a bottle of wine and a couple of beers per week.      Social History   Tobacco Use  Smoking Status Former   Current packs/day: 0.00   Types: Cigarettes   Quit date: 1974   Years since quitting: 51.7  Smokeless Tobacco Never  Tobacco Comments   SMOKED FOR ABOUT 8 YEARS    Current Outpatient Medications on File Prior to Visit  Medication Sig Dispense Refill   atorvastatin  (LIPITOR) 10 MG tablet Take 1 tablet (10 mg total) by mouth daily. 90 tablet 3   Azelastine  HCl 137 MCG/SPRAY SOLN Place 2 sprays into the nose 2 (two) times daily. 90 mL 1   No current facility-administered medications on file prior to visit.     ROS see history of present illness  Objective  Physical Exam Vitals:   02/04/24 0820  BP: (!) 118/58  Pulse: 75  Temp: 97.6 F (36.4 C)  SpO2: 98%    BP Readings from Last 3 Encounters:  02/04/24 (!) 118/58  09/08/23 124/62  09/07/23 124/66   Wt Readings from Last 3 Encounters:  02/04/24 197 lb 12.8 oz (89.7 kg)  09/07/23 196 lb 12.8 oz (89.3 kg)  05/06/23 195 lb 6.4 oz (88.6 kg)    Physical Exam Constitutional:      General: He is not in acute distress.    Appearance: Normal appearance.  HENT:     Head: Normocephalic.     Right Ear: Tympanic membrane normal.     Left Ear: Tympanic membrane  normal.     Nose: Nose normal.     Mouth/Throat:     Mouth: Mucous membranes are moist.     Pharynx: Oropharynx is clear.  Eyes:     Conjunctiva/sclera: Conjunctivae normal.     Pupils: Pupils are equal, round, and reactive to light.  Neck:     Thyroid: No thyromegaly.  Cardiovascular:     Rate and Rhythm: Normal rate and regular rhythm.     Heart sounds: Normal heart sounds.  Pulmonary:     Effort: Pulmonary effort is normal.     Breath sounds: Normal breath sounds.  Abdominal:     General: Abdomen is flat. Bowel sounds are normal.      Palpations: Abdomen is soft. There is no mass.     Tenderness: There is no abdominal tenderness.  Musculoskeletal:        General: Normal range of motion.  Lymphadenopathy:     Cervical: No cervical adenopathy.  Skin:    General: Skin is warm and dry.     Findings: No rash.  Neurological:     General: No focal deficit present.     Mental Status: He is alert.  Psychiatric:        Mood and Affect: Mood normal.        Behavior: Behavior normal.      Assessment/Plan: Please see individual problem list.  Preventative health care Assessment & Plan: Physical exam complete. We will check lab work as outlined. Colonoscopy is due in December, referral placed to GI. Prostate screening will be included in today's blood work. He received a flu shot today. Tetanus shot was updated in April. COVID-19 vaccinations are current with seven doses. Shingles and pneumonia vaccinations are completed. His lifestyle includes regular physical activity and a balanced diet. He does not smoke, consumes alcohol moderately, and does not use illicit drugs. Regular dental and eye check-ups are maintained. Return to care in one year, sooner as needed.    CKD (chronic kidney disease) stage 2, GFR 60-89 ml/min Assessment & Plan: Chronic kidney disease shows improved function, GFR- 63. Follow up with nephrology as scheduled. Advised to avoid NSAIDs and encourage adequate hydration. Check lab work as outlined.   Orders: -     Comprehensive metabolic panel with GFR -     CBC with Differential/Platelet  Mixed hyperlipidemia Assessment & Plan: Managed with Lipitor, with no reported side effects. Continue Lipitor 10 mg daily. Check lipid panel.   Orders: -     Lipid panel  Prediabetes Assessment & Plan: Check A1c. Encourage continued healthy diet and regular exercise.   Orders: -     Hemoglobin A1c  Screening PSA (prostate specific antigen) -     PSA  Thyroid disorder screen -     TSH  Multiple  adenomatous polyps -     Ambulatory referral to Gastroenterology  Screen for colon cancer -     Ambulatory referral to Gastroenterology  Need for influenza vaccination -     Flu vaccine HIGH DOSE PF(Fluzone Trivalent)     Return in about 1 year (around 02/03/2025) for Annual Exam, sooner as needed.   Leron Glance, NP-C [facility]

## 2024-02-09 ENCOUNTER — Encounter: Payer: Self-pay | Admitting: Nurse Practitioner

## 2024-02-09 NOTE — Telephone Encounter (Signed)
 Pt has not received lab comments yet but has reviewed labs online.

## 2024-02-10 ENCOUNTER — Ambulatory Visit: Payer: Self-pay | Admitting: Nurse Practitioner

## 2024-02-17 ENCOUNTER — Other Ambulatory Visit: Payer: Self-pay | Admitting: Internal Medicine

## 2024-02-18 ENCOUNTER — Encounter: Payer: Self-pay | Admitting: Nurse Practitioner

## 2024-02-18 DIAGNOSIS — Z0000 Encounter for general adult medical examination without abnormal findings: Secondary | ICD-10-CM | POA: Insufficient documentation

## 2024-02-18 NOTE — Assessment & Plan Note (Signed)
 Managed with Lipitor, with no reported side effects. Continue Lipitor 10 mg daily. Check lipid panel.

## 2024-02-18 NOTE — Assessment & Plan Note (Signed)
 Check A1c. Encourage continued healthy diet and regular exercise.

## 2024-02-18 NOTE — Assessment & Plan Note (Signed)
 Chronic kidney disease shows improved function, GFR- 63. Follow up with nephrology as scheduled. Advised to avoid NSAIDs and encourage adequate hydration. Check lab work as outlined.

## 2024-02-18 NOTE — Assessment & Plan Note (Signed)
 Physical exam complete. We will check lab work as outlined. Colonoscopy is due in December, referral placed to GI. Prostate screening will be included in today's blood work. He received a flu shot today. Tetanus shot was updated in April. COVID-19 vaccinations are current with seven doses. Shingles and pneumonia vaccinations are completed. His lifestyle includes regular physical activity and a balanced diet. He does not smoke, consumes alcohol moderately, and does not use illicit drugs. Regular dental and eye check-ups are maintained. Return to care in one year, sooner as needed.

## 2024-02-23 DIAGNOSIS — G4733 Obstructive sleep apnea (adult) (pediatric): Secondary | ICD-10-CM | POA: Diagnosis not present

## 2024-02-29 ENCOUNTER — Other Ambulatory Visit: Payer: Self-pay | Admitting: Nurse Practitioner

## 2024-02-29 DIAGNOSIS — J309 Allergic rhinitis, unspecified: Secondary | ICD-10-CM

## 2024-03-02 DIAGNOSIS — N182 Chronic kidney disease, stage 2 (mild): Secondary | ICD-10-CM | POA: Diagnosis not present

## 2024-03-02 DIAGNOSIS — D631 Anemia in chronic kidney disease: Secondary | ICD-10-CM | POA: Diagnosis not present

## 2024-04-01 ENCOUNTER — Other Ambulatory Visit: Payer: Self-pay | Admitting: Nurse Practitioner

## 2024-04-01 DIAGNOSIS — J309 Allergic rhinitis, unspecified: Secondary | ICD-10-CM

## 2024-06-18 ENCOUNTER — Encounter: Payer: Self-pay | Admitting: Nurse Practitioner

## 2024-06-22 ENCOUNTER — Encounter: Payer: Self-pay | Admitting: Nurse Practitioner

## 2024-06-22 ENCOUNTER — Other Ambulatory Visit: Payer: Self-pay | Admitting: Nurse Practitioner

## 2024-06-22 DIAGNOSIS — Z2989 Encounter for other specified prophylactic measures: Secondary | ICD-10-CM

## 2024-06-22 MED ORDER — ACETAZOLAMIDE 125 MG PO TABS: 125.0000 mg | ORAL_TABLET | Freq: Two times a day (BID) | ORAL | 0 refills | Status: AC

## 2025-02-06 ENCOUNTER — Encounter: Admitting: Nurse Practitioner
